# Patient Record
Sex: Male | Born: 1950
Health system: Southern US, Community
[De-identification: ages and names within clinical notes are randomized; demographics above are authoritative.]

## PROBLEM LIST (undated history)

## (undated) DIAGNOSIS — I10 Essential (primary) hypertension: Secondary | ICD-10-CM

## (undated) DIAGNOSIS — I639 Cerebral infarction, unspecified: Secondary | ICD-10-CM

## (undated) DIAGNOSIS — R7301 Impaired fasting glucose: Secondary | ICD-10-CM

## (undated) DIAGNOSIS — L718 Other rosacea: Secondary | ICD-10-CM

## (undated) DIAGNOSIS — Z6828 Body mass index (BMI) 28.0-28.9, adult: Secondary | ICD-10-CM

## (undated) DIAGNOSIS — G459 Transient cerebral ischemic attack, unspecified: Secondary | ICD-10-CM

## (undated) DIAGNOSIS — R42 Dizziness and giddiness: Secondary | ICD-10-CM

## (undated) DIAGNOSIS — E782 Mixed hyperlipidemia: Secondary | ICD-10-CM

## (undated) HISTORY — DX: Essential (primary) hypertension: I10

## (undated) HISTORY — DX: Other rosacea: L71.8

## (undated) HISTORY — DX: Cerebral infarction, unspecified: I63.9

## (undated) HISTORY — DX: Mixed hyperlipidemia: E78.2

## (undated) HISTORY — DX: Dizziness and giddiness: R42

## (undated) HISTORY — DX: Transient cerebral ischemic attack, unspecified: G45.9

## (undated) HISTORY — PX: ANAL FISSURE REPAIR: SHX2312

## (undated) HISTORY — DX: Impaired fasting glucose: R73.01

## (undated) HISTORY — PX: TONSILLECTOMY: SUR1361

## (undated) HISTORY — DX: Body mass index (BMI) 28.0-28.9, adult: Z68.28

---

## 2015-08-11 DIAGNOSIS — R972 Elevated prostate specific antigen [PSA]: Secondary | ICD-10-CM | POA: Diagnosis not present

## 2015-08-11 DIAGNOSIS — N401 Enlarged prostate with lower urinary tract symptoms: Secondary | ICD-10-CM | POA: Diagnosis not present

## 2015-09-02 DIAGNOSIS — R972 Elevated prostate specific antigen [PSA]: Secondary | ICD-10-CM | POA: Diagnosis not present

## 2015-09-02 DIAGNOSIS — N401 Enlarged prostate with lower urinary tract symptoms: Secondary | ICD-10-CM | POA: Diagnosis not present

## 2015-09-04 DIAGNOSIS — Z1211 Encounter for screening for malignant neoplasm of colon: Secondary | ICD-10-CM | POA: Diagnosis not present

## 2015-09-12 LAB — HM COLONOSCOPY

## 2015-09-22 DIAGNOSIS — Z1211 Encounter for screening for malignant neoplasm of colon: Secondary | ICD-10-CM | POA: Diagnosis not present

## 2015-09-22 DIAGNOSIS — K648 Other hemorrhoids: Secondary | ICD-10-CM | POA: Diagnosis not present

## 2015-09-22 DIAGNOSIS — D122 Benign neoplasm of ascending colon: Secondary | ICD-10-CM | POA: Diagnosis not present

## 2015-09-22 DIAGNOSIS — K5730 Diverticulosis of large intestine without perforation or abscess without bleeding: Secondary | ICD-10-CM | POA: Diagnosis not present

## 2015-09-30 DIAGNOSIS — N401 Enlarged prostate with lower urinary tract symptoms: Secondary | ICD-10-CM | POA: Diagnosis not present

## 2015-09-30 DIAGNOSIS — N419 Inflammatory disease of prostate, unspecified: Secondary | ICD-10-CM | POA: Diagnosis not present

## 2015-09-30 DIAGNOSIS — R972 Elevated prostate specific antigen [PSA]: Secondary | ICD-10-CM | POA: Diagnosis not present

## 2015-10-07 DIAGNOSIS — R972 Elevated prostate specific antigen [PSA]: Secondary | ICD-10-CM | POA: Diagnosis not present

## 2015-10-07 DIAGNOSIS — N529 Male erectile dysfunction, unspecified: Secondary | ICD-10-CM | POA: Diagnosis not present

## 2015-10-07 DIAGNOSIS — N401 Enlarged prostate with lower urinary tract symptoms: Secondary | ICD-10-CM | POA: Diagnosis not present

## 2015-10-13 DIAGNOSIS — Z23 Encounter for immunization: Secondary | ICD-10-CM | POA: Diagnosis not present

## 2015-10-13 DIAGNOSIS — E782 Mixed hyperlipidemia: Secondary | ICD-10-CM | POA: Diagnosis not present

## 2015-10-30 DIAGNOSIS — H2513 Age-related nuclear cataract, bilateral: Secondary | ICD-10-CM | POA: Diagnosis not present

## 2015-11-19 DIAGNOSIS — L578 Other skin changes due to chronic exposure to nonionizing radiation: Secondary | ICD-10-CM | POA: Diagnosis not present

## 2015-11-19 DIAGNOSIS — L570 Actinic keratosis: Secondary | ICD-10-CM | POA: Diagnosis not present

## 2015-11-19 DIAGNOSIS — C44319 Basal cell carcinoma of skin of other parts of face: Secondary | ICD-10-CM | POA: Diagnosis not present

## 2015-11-19 DIAGNOSIS — L821 Other seborrheic keratosis: Secondary | ICD-10-CM | POA: Diagnosis not present

## 2016-01-14 DIAGNOSIS — H6121 Impacted cerumen, right ear: Secondary | ICD-10-CM | POA: Diagnosis not present

## 2016-01-14 DIAGNOSIS — E782 Mixed hyperlipidemia: Secondary | ICD-10-CM | POA: Diagnosis not present

## 2016-01-14 DIAGNOSIS — L853 Xerosis cutis: Secondary | ICD-10-CM | POA: Diagnosis not present

## 2016-04-15 DIAGNOSIS — Z1159 Encounter for screening for other viral diseases: Secondary | ICD-10-CM | POA: Diagnosis not present

## 2016-04-15 DIAGNOSIS — E782 Mixed hyperlipidemia: Secondary | ICD-10-CM | POA: Diagnosis not present

## 2016-04-15 DIAGNOSIS — H60541 Acute eczematoid otitis externa, right ear: Secondary | ICD-10-CM | POA: Diagnosis not present

## 2016-04-15 DIAGNOSIS — R7301 Impaired fasting glucose: Secondary | ICD-10-CM | POA: Diagnosis not present

## 2016-04-15 DIAGNOSIS — R42 Dizziness and giddiness: Secondary | ICD-10-CM | POA: Diagnosis not present

## 2016-04-21 DIAGNOSIS — R972 Elevated prostate specific antigen [PSA]: Secondary | ICD-10-CM | POA: Diagnosis not present

## 2016-04-21 DIAGNOSIS — N401 Enlarged prostate with lower urinary tract symptoms: Secondary | ICD-10-CM | POA: Diagnosis not present

## 2016-06-08 DIAGNOSIS — R42 Dizziness and giddiness: Secondary | ICD-10-CM | POA: Diagnosis not present

## 2016-08-12 DIAGNOSIS — I10 Essential (primary) hypertension: Secondary | ICD-10-CM | POA: Diagnosis not present

## 2016-08-12 DIAGNOSIS — E782 Mixed hyperlipidemia: Secondary | ICD-10-CM | POA: Diagnosis not present

## 2016-08-12 DIAGNOSIS — Z0001 Encounter for general adult medical examination with abnormal findings: Secondary | ICD-10-CM | POA: Diagnosis not present

## 2016-08-12 DIAGNOSIS — Z23 Encounter for immunization: Secondary | ICD-10-CM | POA: Diagnosis not present

## 2016-08-12 DIAGNOSIS — R42 Dizziness and giddiness: Secondary | ICD-10-CM | POA: Diagnosis not present

## 2016-08-19 DIAGNOSIS — R972 Elevated prostate specific antigen [PSA]: Secondary | ICD-10-CM | POA: Diagnosis not present

## 2016-09-28 DIAGNOSIS — I10 Essential (primary) hypertension: Secondary | ICD-10-CM | POA: Diagnosis not present

## 2016-09-28 DIAGNOSIS — Z23 Encounter for immunization: Secondary | ICD-10-CM | POA: Diagnosis not present

## 2016-09-28 DIAGNOSIS — E782 Mixed hyperlipidemia: Secondary | ICD-10-CM | POA: Diagnosis not present

## 2016-11-12 DIAGNOSIS — N401 Enlarged prostate with lower urinary tract symptoms: Secondary | ICD-10-CM | POA: Diagnosis not present

## 2016-11-12 DIAGNOSIS — R972 Elevated prostate specific antigen [PSA]: Secondary | ICD-10-CM | POA: Diagnosis not present

## 2016-11-23 DIAGNOSIS — L821 Other seborrheic keratosis: Secondary | ICD-10-CM | POA: Diagnosis not present

## 2016-11-23 DIAGNOSIS — C44319 Basal cell carcinoma of skin of other parts of face: Secondary | ICD-10-CM | POA: Diagnosis not present

## 2016-11-23 DIAGNOSIS — L219 Seborrheic dermatitis, unspecified: Secondary | ICD-10-CM | POA: Diagnosis not present

## 2016-11-23 DIAGNOSIS — L578 Other skin changes due to chronic exposure to nonionizing radiation: Secondary | ICD-10-CM | POA: Diagnosis not present

## 2016-12-21 DIAGNOSIS — R972 Elevated prostate specific antigen [PSA]: Secondary | ICD-10-CM | POA: Diagnosis not present

## 2016-12-21 DIAGNOSIS — R9721 Rising PSA following treatment for malignant neoplasm of prostate: Secondary | ICD-10-CM | POA: Diagnosis not present

## 2017-01-20 DIAGNOSIS — I10 Essential (primary) hypertension: Secondary | ICD-10-CM | POA: Diagnosis not present

## 2017-01-20 DIAGNOSIS — E782 Mixed hyperlipidemia: Secondary | ICD-10-CM | POA: Diagnosis not present

## 2017-01-20 DIAGNOSIS — R7301 Impaired fasting glucose: Secondary | ICD-10-CM | POA: Diagnosis not present

## 2017-01-20 DIAGNOSIS — I69393 Ataxia following cerebral infarction: Secondary | ICD-10-CM | POA: Diagnosis not present

## 2017-01-24 DIAGNOSIS — I6523 Occlusion and stenosis of bilateral carotid arteries: Secondary | ICD-10-CM | POA: Diagnosis not present

## 2017-04-28 DIAGNOSIS — I69393 Ataxia following cerebral infarction: Secondary | ICD-10-CM | POA: Diagnosis not present

## 2017-04-28 DIAGNOSIS — E782 Mixed hyperlipidemia: Secondary | ICD-10-CM | POA: Diagnosis not present

## 2017-04-28 DIAGNOSIS — I10 Essential (primary) hypertension: Secondary | ICD-10-CM | POA: Diagnosis not present

## 2017-04-28 DIAGNOSIS — R7301 Impaired fasting glucose: Secondary | ICD-10-CM | POA: Diagnosis not present

## 2017-05-12 DIAGNOSIS — N401 Enlarged prostate with lower urinary tract symptoms: Secondary | ICD-10-CM | POA: Diagnosis not present

## 2017-05-12 DIAGNOSIS — R972 Elevated prostate specific antigen [PSA]: Secondary | ICD-10-CM | POA: Diagnosis not present

## 2017-05-12 DIAGNOSIS — R339 Retention of urine, unspecified: Secondary | ICD-10-CM | POA: Diagnosis not present

## 2017-05-16 DIAGNOSIS — I69393 Ataxia following cerebral infarction: Secondary | ICD-10-CM | POA: Diagnosis not present

## 2017-05-16 DIAGNOSIS — R270 Ataxia, unspecified: Secondary | ICD-10-CM | POA: Diagnosis not present

## 2017-05-16 DIAGNOSIS — I639 Cerebral infarction, unspecified: Secondary | ICD-10-CM | POA: Diagnosis not present

## 2017-05-20 ENCOUNTER — Encounter: Payer: Self-pay | Admitting: Neurology

## 2017-08-02 ENCOUNTER — Ambulatory Visit: Payer: Self-pay | Admitting: Neurology

## 2017-08-02 ENCOUNTER — Encounter

## 2017-08-22 NOTE — Progress Notes (Signed)
NEUROLOGY CONSULTATION NOTE  Jeremiah ChimesRonald A Russell MRN: [id number] DOB: May 15, 1950  Referring provider: Blane OharaKirsten Cox, MD Primary care provider: Blane OharaKirsten Cox, MD  Reason for consult:  ataxia  HISTORY OF PRESENT ILLNESS: Jeremiah ChimesRonald A Russell is a 67 year old right-handed male hypertension, hyperlipidemia, and history of several strokes including cerebellar stroke with residual ataxia who presents for ataxia.  History supplemented by referring provider's note.  He has significant stroke risk factors and a stroke in 1998.  At that time, he woke up with left arm numbness and was found to have a stroke.  He had stopped taking ASA.  He started having trouble with balance in 2006.  He feels it has gotten worse over the past few months.  If he walks on uneven ground, he loses his balance.  He denies dizziness, spinning or lightheadedness.  If he stands in the shower, he feels like he may fall.  He fell in the bedroom when he lost his balance when he bent over to pick up a pillow.  He has to be careful when walking down the stairs.  Walking upstairs is not as much of a problem. Vision is good.  He denies numbness in the feet.  He denies weakness in the legs.  He denies back pain.  He was started on metformin 3 or 4 months ago for pre-diabetes.  He restarted ASA 81mg  3 months ago as well.    MRI of head without contrast from 05/16/17 was personally reviewed and showed small 5 mm punctate acute infarct in the left parietal white matter as well as possible acute versus chronic punctate infarct in right frontal white matter.  Otherwise, moderate atrophy with extensive chronic ischemic changes in the cerebral white matter and brainstem.  He had a carotid doppler on 01/24/17, which reportedly showed bilateral thickening of the CCA, bulb, proximal ICA, ECA intima and plaque in the bulb and proximal ICA based on the sonographer's comments, but report from reading physician not available.    Medications include:  ASA 81mg ,  Plavix 75mg , lisinopril 5mg , metformin, Crestor 10mg   PAST MEDICAL HISTORY: Hypertension Hyperlipidemia Carotid artery disease Prediabetes History of stroke  PAST SURGICAL HISTORY: Anal fissure repair  MEDICATIONS: ASA 81mg  daily Plavix 75mg  daily Metformin 500mg  daily Lisinopril 5mg  daily Crestor 10mg  daily   ALLERGIES: No  FAMILY HISTORY: Father - Alzheimer's   SOCIAL HISTORY: Social History   Socioeconomic History  . Marital status: Single    Spouse name: Not on file  . Number of children: Not on file  . Years of education: Not on file  . Highest education level: Not on file  Occupational History  . Not on file  Social Needs  . Financial resource strain: Not on file  . Food insecurity:    Worry: Not on file    Inability: Not on file  . Transportation needs:    Medical: Not on file    Non-medical: Not on file  Tobacco Use  . Smoking status: Not on file  Substance and Sexual Activity  . Alcohol use: Not on file  . Drug use: Not on file  . Sexual activity: Not on file  Lifestyle  . Physical activity:    Days per week: Not on file    Minutes per session: Not on file  . Stress: Not on file  Relationships  . Social connections:    Talks on phone: Not on file    Gets together: Not on file    Attends religious  service: Not on file    Active member of club or organization: Not on file    Attends meetings of clubs or organizations: Not on file    Relationship status: Not on file  . Intimate partner violence:    Fear of current or ex partner: Not on file    Emotionally abused: Not on file    Physically abused: Not on file    Forced sexual activity: Not on file  Other Topics Concern  . Not on file  Social History Narrative  . Not on file    REVIEW OF SYSTEMS: Constitutional: No fevers, chills, or sweats, no generalized fatigue, change in appetite Eyes: No visual changes, double vision, eye pain Ear, nose and throat: No hearing loss, ear pain, nasal  congestion, sore throat Cardiovascular: No chest pain, palpitations Respiratory:  No shortness of breath at rest or with exertion, wheezes GastrointestinaI: No nausea, vomiting, diarrhea, abdominal pain, fecal incontinence Genitourinary:  No dysuria, urinary retention or frequency Musculoskeletal:  No neck pain, back pain Integumentary: No rash, pruritus, skin lesions Neurological: as above Psychiatric: No depression, insomnia, anxiety Endocrine: No palpitations, fatigue, diaphoresis, mood swings, change in appetite, change in weight, increased thirst Hematologic/Lymphatic:  No purpura, petechiae. Allergic/Immunologic: no itchy/runny eyes, nasal congestion, recent allergic reactions, rashes  PHYSICAL EXAM: Blood pressure 114/76, pulse 70, height 5\' 8"  (1.727 m), weight 188 lb (85.3 kg), SpO2 98 %. General: No acute distress.  Patient appears well-groomed.   Head:  Normocephalic/atraumatic Eyes:  fundi examined but not visualized Neck: supple, no paraspinal tenderness, full range of motion Back: No paraspinal tenderness Heart: regular rate and rhythm Lungs: Clear to auscultation bilaterally. Vascular: No carotid bruits. Neurological Exam: Mental status: alert and oriented to person, place, and time, recent and remote memory intact, fund of knowledge intact, attention and concentration intact, speech fluent and not dysarthric, language intact. Cranial nerves: CN I: not tested CN II: pupils equal, round and reactive to light, visual fields intact CN III, IV, VI:  full range of motion, no nystagmus, no ptosis CN V: facial sensation intact CN VII: upper and lower face symmetric CN VIII: hearing intact CN IX, X: gag intact, uvula midline CN XI: sternocleidomastoid and trapezius muscles intact CN XII: tongue midline Bulk & Tone: normal, no fasciculations. Motor:  5/5 throughout  Sensation:  Pinprick sensation slightly reduced in toes and vibration sensation intact.  Deep Tendon  Reflexes:  Mildly brisk throughout, toes downgoing.   Finger to nose testing:  Without dysmetria.   Heel to shin:  Without dysmetria.   Gait:  Wide-based.  Able to turn. Romberg negative  IMPRESSION: 1.  Unsteady gait, unclear etiology.  He does not exhibit any significant peripheral neuropathy or weakness.  Given brisk reflexes, consider cervical spinal stenosis causing myelopathy.  It may be secondary to cerebrovascular disease involving the brainstem as well.   2.  Cerebrovascular disease with evidence of acute punctate strokes on MRI.  Incidental findings and not contributing to his symptoms. 3.  HTN 4.  Hyperlipidemia 5.  Prediabetes  PLAN: 1.  We will check MRI of cervical spine to evaluate for spinal stenosis causing myelopathy. 2.  Maximum medical management for secondary stroke prevention:  History is unclear.  From a secondary stroke standpoint, he does not need to remain on dual antiplatelet therapy.  Consider continuing either ASA or Plavix for secondary stroke prevention 3.  Statin therapy (LDL goal less than 70), blood pressure control and glycemic control as per PCP. 4.  If MRI unremarkable, recommend physical therapy  Thank you for allowing me to take part in the care of this patient.  Shon Millet, DO  CC:  Blane Ohara, MD

## 2017-08-23 ENCOUNTER — Ambulatory Visit: Payer: Medicare Other | Admitting: Neurology

## 2017-08-23 ENCOUNTER — Encounter: Payer: Self-pay | Admitting: Neurology

## 2017-08-23 VITALS — BP 114/76 | HR 70 | Ht 68.0 in | Wt 188.0 lb

## 2017-08-23 DIAGNOSIS — I10 Essential (primary) hypertension: Secondary | ICD-10-CM | POA: Diagnosis not present

## 2017-08-23 DIAGNOSIS — R292 Abnormal reflex: Secondary | ICD-10-CM

## 2017-08-23 DIAGNOSIS — E785 Hyperlipidemia, unspecified: Secondary | ICD-10-CM

## 2017-08-23 DIAGNOSIS — R7303 Prediabetes: Secondary | ICD-10-CM

## 2017-08-23 DIAGNOSIS — I679 Cerebrovascular disease, unspecified: Secondary | ICD-10-CM | POA: Diagnosis not present

## 2017-08-23 DIAGNOSIS — R2681 Unsteadiness on feet: Secondary | ICD-10-CM

## 2017-08-23 NOTE — Patient Instructions (Addendum)
1.  We will check MRI of cervical spine to evaluate for pinching of spinal cord that may be contributing to balance problems 2.  Further recommendations pending results.  We [facility] and they will call you directly to schedule your appt. They are located at [address] If you need to contact them directly please call [phone].

## 2017-09-06 ENCOUNTER — Ambulatory Visit
Admission: RE | Admit: 2017-09-06 | Discharge: 2017-09-06 | Disposition: A | Discharge status: Home / Self Care | Payer: Medicare Other | Source: Ambulatory Visit | Attending: Neurology | Admitting: Neurology

## 2017-09-06 DIAGNOSIS — R2681 Unsteadiness on feet: Secondary | ICD-10-CM

## 2017-09-06 DIAGNOSIS — M4802 Spinal stenosis, cervical region: Secondary | ICD-10-CM | POA: Diagnosis not present

## 2017-09-06 DIAGNOSIS — R292 Abnormal reflex: Secondary | ICD-10-CM

## 2017-09-08 ENCOUNTER — Telehealth: Payer: Self-pay

## 2017-09-08 ENCOUNTER — Telehealth: Payer: Self-pay | Admitting: Neurology

## 2017-09-08 DIAGNOSIS — R2681 Unsteadiness on feet: Secondary | ICD-10-CM

## 2017-09-08 NOTE — Telephone Encounter (Signed)
-----   Message from Drema Dallas, DO sent at 09/06/2017 12:36 PM EDT ----- MRI of cervical spine does not reveal any cause for unsteady gait.  Recommend physical therapy for unsteady gait.

## 2017-09-08 NOTE — Telephone Encounter (Signed)
Patient called back because Meagen left a message to call back regarding MRI results. Please call him back at [phone]. Thanks.

## 2017-09-08 NOTE — Telephone Encounter (Signed)
Will refer to PT of his choice.

## 2017-09-08 NOTE — Telephone Encounter (Signed)
LMOM relaying message below.  Asked for return call about PT referral.

## 2017-09-13 DIAGNOSIS — Z125 Encounter for screening for malignant neoplasm of prostate: Secondary | ICD-10-CM | POA: Diagnosis not present

## 2017-09-13 DIAGNOSIS — E782 Mixed hyperlipidemia: Secondary | ICD-10-CM | POA: Diagnosis not present

## 2017-09-13 DIAGNOSIS — Z0000 Encounter for general adult medical examination without abnormal findings: Secondary | ICD-10-CM | POA: Diagnosis not present

## 2017-09-13 DIAGNOSIS — Z23 Encounter for immunization: Secondary | ICD-10-CM | POA: Diagnosis not present

## 2017-09-13 DIAGNOSIS — E663 Overweight: Secondary | ICD-10-CM | POA: Diagnosis not present

## 2017-09-13 DIAGNOSIS — Z0001 Encounter for general adult medical examination with abnormal findings: Secondary | ICD-10-CM | POA: Diagnosis not present

## 2017-09-13 DIAGNOSIS — R7301 Impaired fasting glucose: Secondary | ICD-10-CM | POA: Diagnosis not present

## 2017-09-13 DIAGNOSIS — Z6828 Body mass index (BMI) 28.0-28.9, adult: Secondary | ICD-10-CM | POA: Diagnosis not present

## 2017-09-14 NOTE — Telephone Encounter (Signed)
Returned call.  No answer.  LMOM asking for return call to relay MRI results  Sandi - this is a pt of Dr. Moises Blood, I have been playing phone tag with for a week now.

## 2017-09-14 NOTE — Telephone Encounter (Signed)
Called and spoke with Pt, advised of MRI results and P/T recommendation. Pt requests a therapy office in [facility]. Called [facility], [phone] spoke with Jesusita Oka. Fax#[phone], will fax order

## 2017-09-20 DIAGNOSIS — R262 Difficulty in walking, not elsewhere classified: Secondary | ICD-10-CM | POA: Diagnosis not present

## 2017-09-20 DIAGNOSIS — M6281 Muscle weakness (generalized): Secondary | ICD-10-CM | POA: Diagnosis not present

## 2017-09-27 DIAGNOSIS — M6281 Muscle weakness (generalized): Secondary | ICD-10-CM | POA: Diagnosis not present

## 2017-09-27 DIAGNOSIS — R262 Difficulty in walking, not elsewhere classified: Secondary | ICD-10-CM | POA: Diagnosis not present

## 2017-09-29 DIAGNOSIS — M6281 Muscle weakness (generalized): Secondary | ICD-10-CM | POA: Diagnosis not present

## 2017-09-29 DIAGNOSIS — R262 Difficulty in walking, not elsewhere classified: Secondary | ICD-10-CM | POA: Diagnosis not present

## 2017-10-04 DIAGNOSIS — R262 Difficulty in walking, not elsewhere classified: Secondary | ICD-10-CM | POA: Diagnosis not present

## 2017-10-04 DIAGNOSIS — M6281 Muscle weakness (generalized): Secondary | ICD-10-CM | POA: Diagnosis not present

## 2017-10-06 DIAGNOSIS — M6281 Muscle weakness (generalized): Secondary | ICD-10-CM | POA: Diagnosis not present

## 2017-10-06 DIAGNOSIS — R262 Difficulty in walking, not elsewhere classified: Secondary | ICD-10-CM | POA: Diagnosis not present

## 2017-10-11 DIAGNOSIS — M6281 Muscle weakness (generalized): Secondary | ICD-10-CM | POA: Diagnosis not present

## 2017-10-11 DIAGNOSIS — R262 Difficulty in walking, not elsewhere classified: Secondary | ICD-10-CM | POA: Diagnosis not present

## 2017-10-13 DIAGNOSIS — R262 Difficulty in walking, not elsewhere classified: Secondary | ICD-10-CM | POA: Diagnosis not present

## 2017-10-13 DIAGNOSIS — M6281 Muscle weakness (generalized): Secondary | ICD-10-CM | POA: Diagnosis not present

## 2017-10-18 DIAGNOSIS — M6281 Muscle weakness (generalized): Secondary | ICD-10-CM | POA: Diagnosis not present

## 2017-10-18 DIAGNOSIS — R262 Difficulty in walking, not elsewhere classified: Secondary | ICD-10-CM | POA: Diagnosis not present

## 2017-10-20 DIAGNOSIS — M6281 Muscle weakness (generalized): Secondary | ICD-10-CM | POA: Diagnosis not present

## 2017-10-20 DIAGNOSIS — R262 Difficulty in walking, not elsewhere classified: Secondary | ICD-10-CM | POA: Diagnosis not present

## 2017-10-25 DIAGNOSIS — R262 Difficulty in walking, not elsewhere classified: Secondary | ICD-10-CM | POA: Diagnosis not present

## 2017-10-25 DIAGNOSIS — M6281 Muscle weakness (generalized): Secondary | ICD-10-CM | POA: Diagnosis not present

## 2017-10-27 DIAGNOSIS — R262 Difficulty in walking, not elsewhere classified: Secondary | ICD-10-CM | POA: Diagnosis not present

## 2017-10-27 DIAGNOSIS — M6281 Muscle weakness (generalized): Secondary | ICD-10-CM | POA: Diagnosis not present

## 2017-11-01 DIAGNOSIS — M6281 Muscle weakness (generalized): Secondary | ICD-10-CM | POA: Diagnosis not present

## 2017-11-01 DIAGNOSIS — R262 Difficulty in walking, not elsewhere classified: Secondary | ICD-10-CM | POA: Diagnosis not present

## 2017-11-03 DIAGNOSIS — R262 Difficulty in walking, not elsewhere classified: Secondary | ICD-10-CM | POA: Diagnosis not present

## 2017-11-03 DIAGNOSIS — M6281 Muscle weakness (generalized): Secondary | ICD-10-CM | POA: Diagnosis not present

## 2017-11-08 DIAGNOSIS — M6281 Muscle weakness (generalized): Secondary | ICD-10-CM | POA: Diagnosis not present

## 2017-11-08 DIAGNOSIS — R262 Difficulty in walking, not elsewhere classified: Secondary | ICD-10-CM | POA: Diagnosis not present

## 2017-11-10 DIAGNOSIS — R262 Difficulty in walking, not elsewhere classified: Secondary | ICD-10-CM | POA: Diagnosis not present

## 2017-11-10 DIAGNOSIS — M6281 Muscle weakness (generalized): Secondary | ICD-10-CM | POA: Diagnosis not present

## 2017-11-15 DIAGNOSIS — R262 Difficulty in walking, not elsewhere classified: Secondary | ICD-10-CM | POA: Diagnosis not present

## 2017-11-15 DIAGNOSIS — M6281 Muscle weakness (generalized): Secondary | ICD-10-CM | POA: Diagnosis not present

## 2017-11-17 DIAGNOSIS — R262 Difficulty in walking, not elsewhere classified: Secondary | ICD-10-CM | POA: Diagnosis not present

## 2017-11-17 DIAGNOSIS — M6281 Muscle weakness (generalized): Secondary | ICD-10-CM | POA: Diagnosis not present

## 2017-11-22 DIAGNOSIS — R262 Difficulty in walking, not elsewhere classified: Secondary | ICD-10-CM | POA: Diagnosis not present

## 2017-11-22 DIAGNOSIS — M6281 Muscle weakness (generalized): Secondary | ICD-10-CM | POA: Diagnosis not present

## 2017-11-23 DIAGNOSIS — L821 Other seborrheic keratosis: Secondary | ICD-10-CM | POA: Diagnosis not present

## 2017-11-23 DIAGNOSIS — C44319 Basal cell carcinoma of skin of other parts of face: Secondary | ICD-10-CM | POA: Diagnosis not present

## 2017-11-23 DIAGNOSIS — L578 Other skin changes due to chronic exposure to nonionizing radiation: Secondary | ICD-10-CM | POA: Diagnosis not present

## 2017-11-24 DIAGNOSIS — M6281 Muscle weakness (generalized): Secondary | ICD-10-CM | POA: Diagnosis not present

## 2017-11-24 DIAGNOSIS — R262 Difficulty in walking, not elsewhere classified: Secondary | ICD-10-CM | POA: Diagnosis not present

## 2017-11-29 DIAGNOSIS — R262 Difficulty in walking, not elsewhere classified: Secondary | ICD-10-CM | POA: Diagnosis not present

## 2017-11-29 DIAGNOSIS — M6281 Muscle weakness (generalized): Secondary | ICD-10-CM | POA: Diagnosis not present

## 2017-12-06 DIAGNOSIS — M6281 Muscle weakness (generalized): Secondary | ICD-10-CM | POA: Diagnosis not present

## 2017-12-06 DIAGNOSIS — R262 Difficulty in walking, not elsewhere classified: Secondary | ICD-10-CM | POA: Diagnosis not present

## 2017-12-08 DIAGNOSIS — M6281 Muscle weakness (generalized): Secondary | ICD-10-CM | POA: Diagnosis not present

## 2017-12-08 DIAGNOSIS — R262 Difficulty in walking, not elsewhere classified: Secondary | ICD-10-CM | POA: Diagnosis not present

## 2017-12-13 DIAGNOSIS — R262 Difficulty in walking, not elsewhere classified: Secondary | ICD-10-CM | POA: Diagnosis not present

## 2017-12-13 DIAGNOSIS — M6281 Muscle weakness (generalized): Secondary | ICD-10-CM | POA: Diagnosis not present

## 2017-12-15 DIAGNOSIS — R262 Difficulty in walking, not elsewhere classified: Secondary | ICD-10-CM | POA: Diagnosis not present

## 2017-12-15 DIAGNOSIS — M6281 Muscle weakness (generalized): Secondary | ICD-10-CM | POA: Diagnosis not present

## 2017-12-20 DIAGNOSIS — M6281 Muscle weakness (generalized): Secondary | ICD-10-CM | POA: Diagnosis not present

## 2017-12-20 DIAGNOSIS — R262 Difficulty in walking, not elsewhere classified: Secondary | ICD-10-CM | POA: Diagnosis not present

## 2017-12-22 DIAGNOSIS — R262 Difficulty in walking, not elsewhere classified: Secondary | ICD-10-CM | POA: Diagnosis not present

## 2017-12-22 DIAGNOSIS — M6281 Muscle weakness (generalized): Secondary | ICD-10-CM | POA: Diagnosis not present

## 2018-01-03 DIAGNOSIS — M6281 Muscle weakness (generalized): Secondary | ICD-10-CM | POA: Diagnosis not present

## 2018-01-03 DIAGNOSIS — R262 Difficulty in walking, not elsewhere classified: Secondary | ICD-10-CM | POA: Diagnosis not present

## 2018-01-05 DIAGNOSIS — R262 Difficulty in walking, not elsewhere classified: Secondary | ICD-10-CM | POA: Diagnosis not present

## 2018-01-05 DIAGNOSIS — M6281 Muscle weakness (generalized): Secondary | ICD-10-CM | POA: Diagnosis not present

## 2018-01-06 DIAGNOSIS — I69393 Ataxia following cerebral infarction: Secondary | ICD-10-CM | POA: Diagnosis not present

## 2018-01-06 DIAGNOSIS — I10 Essential (primary) hypertension: Secondary | ICD-10-CM | POA: Diagnosis not present

## 2018-01-06 DIAGNOSIS — R7301 Impaired fasting glucose: Secondary | ICD-10-CM | POA: Diagnosis not present

## 2018-01-06 DIAGNOSIS — E782 Mixed hyperlipidemia: Secondary | ICD-10-CM | POA: Diagnosis not present

## 2018-01-11 DIAGNOSIS — N401 Enlarged prostate with lower urinary tract symptoms: Secondary | ICD-10-CM | POA: Diagnosis not present

## 2018-01-11 DIAGNOSIS — R972 Elevated prostate specific antigen [PSA]: Secondary | ICD-10-CM | POA: Diagnosis not present

## 2018-04-10 DIAGNOSIS — I10 Essential (primary) hypertension: Secondary | ICD-10-CM | POA: Diagnosis not present

## 2018-04-10 DIAGNOSIS — I69393 Ataxia following cerebral infarction: Secondary | ICD-10-CM | POA: Diagnosis not present

## 2018-04-10 DIAGNOSIS — E782 Mixed hyperlipidemia: Secondary | ICD-10-CM | POA: Diagnosis not present

## 2018-04-10 DIAGNOSIS — R7301 Impaired fasting glucose: Secondary | ICD-10-CM | POA: Diagnosis not present

## 2019-03-14 IMAGING — MR MR CERVICAL SPINE W/O CM
5 series · 29 of 48 positions shown · non-contrast
Comparison: None.

CLINICAL DATA: Unsteady gait for 6 months. Hyperreflexia of the
lower extremity.

EXAM:
MRI CERVICAL SPINE WITHOUT CONTRAST
TECHNIQUE: Multiplanar, multisequence MR imaging of the cervical spine was
performed. No intravenous contrast was administered.

[Series 3: T2 · sagittal · 3.0mm · 0.41mm/px · 6 of 15 slices shown (1 of 2)]
[im 1/15]
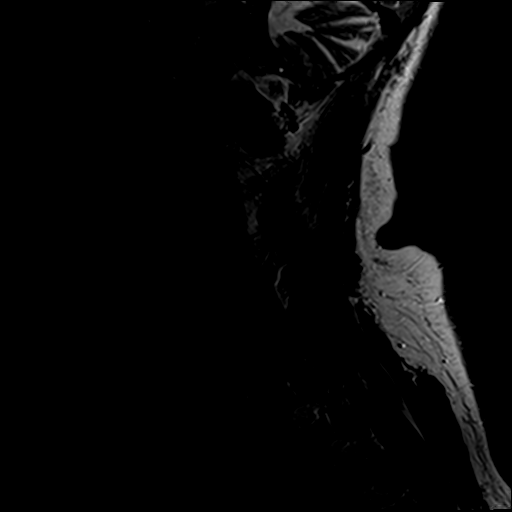
[im 3/15]
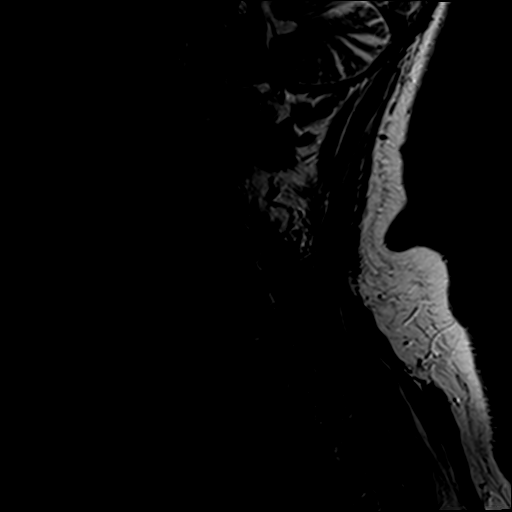
[im 6/15]
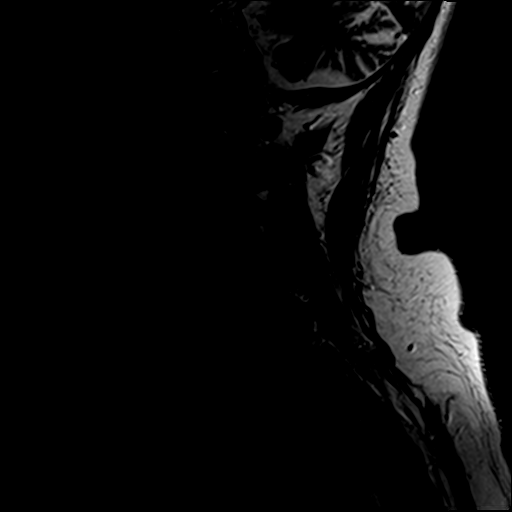
[im 9/15]
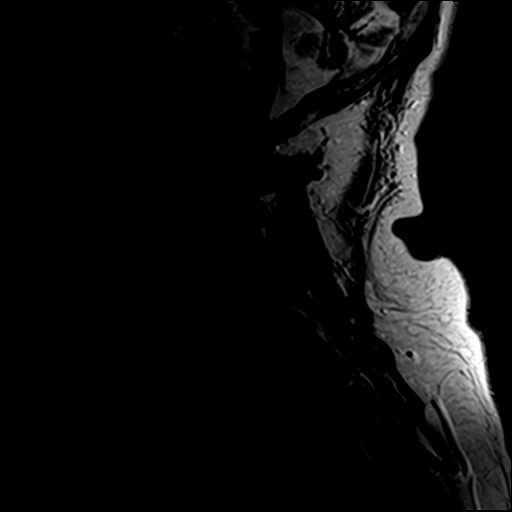
[im 12/15]
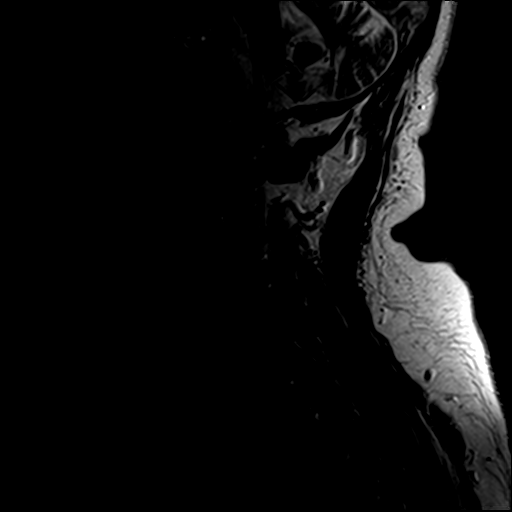
[im 15/15]
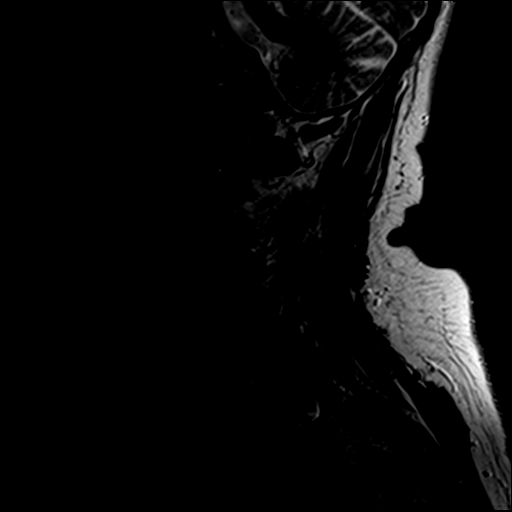

[Series 4: T1 · sagittal · 3.0mm · 0.41mm/px · 6 of 15 slices shown]
[im 1/15]
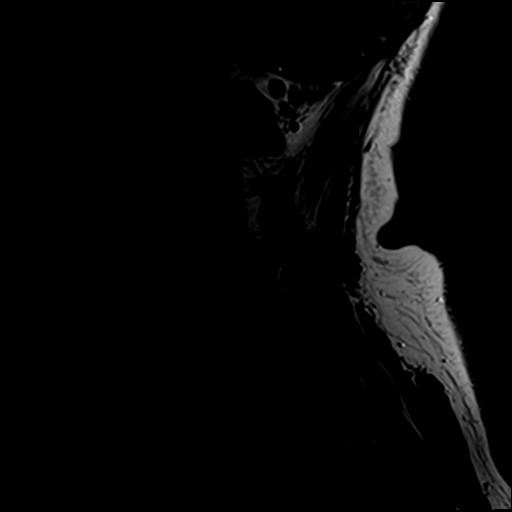
[im 3/15]
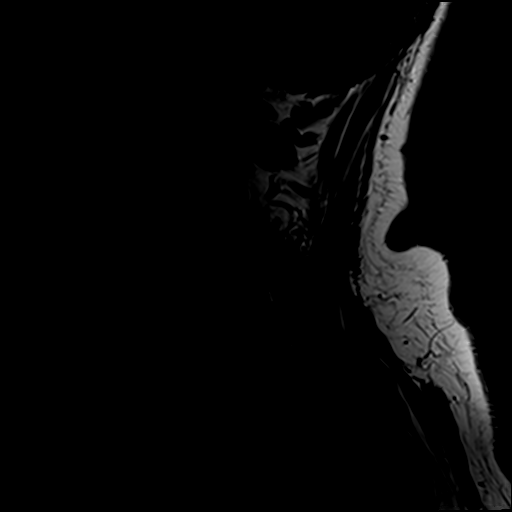
[im 6/15]
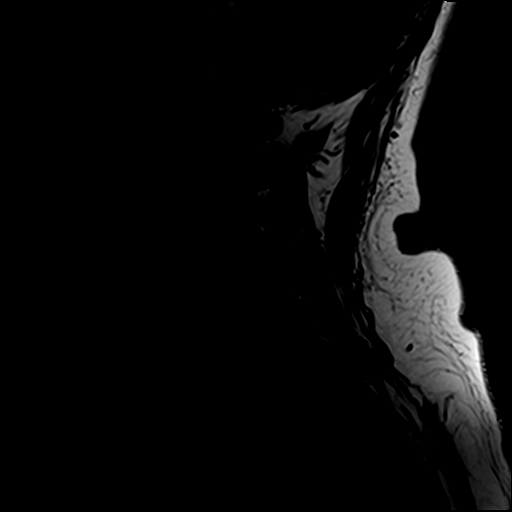
[im 9/15]
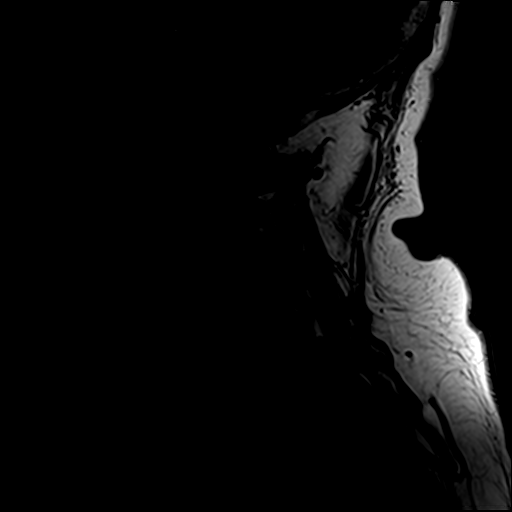
[im 12/15]
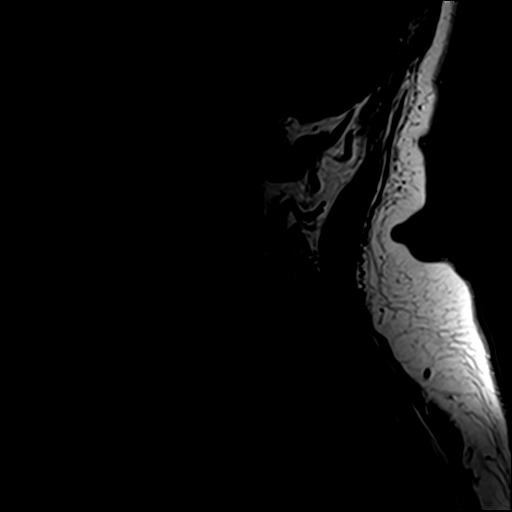
[im 15/15]
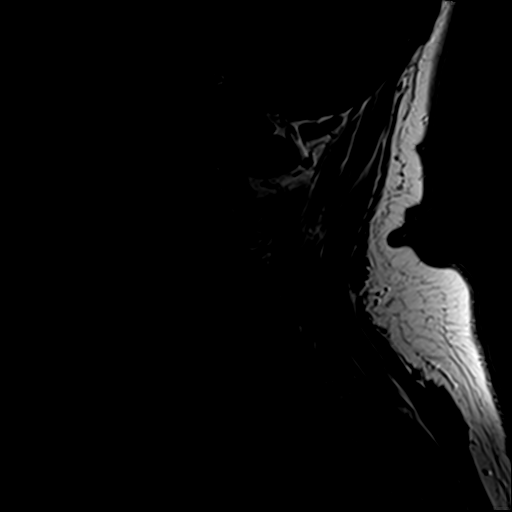

[Series 5: STIR · sagittal · 3.0mm · 0.82mm/px · 6 of 15 slices shown]
[im 1/15]
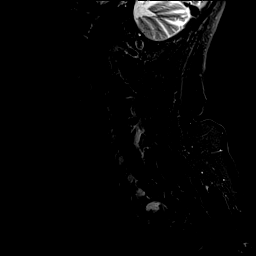
[im 3/15]
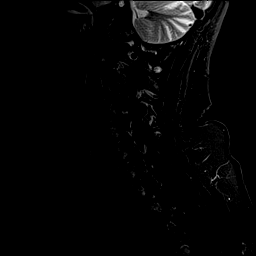
[im 6/15]
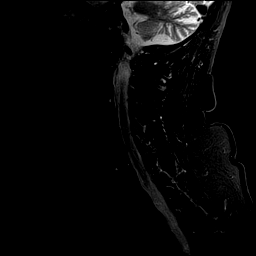
[im 9/15]
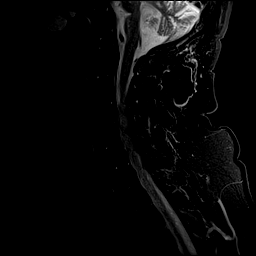
[im 12/15]
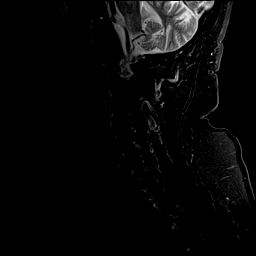
[im 15/15]
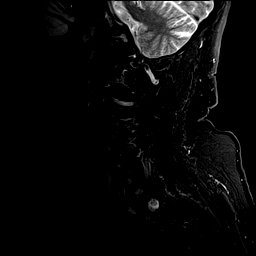

[Series 6: GRE · axial · 3.0mm · 0.35mm/px · z∈[-103,-85]mm · 2 of 36 slices shown]
[im 1/36]
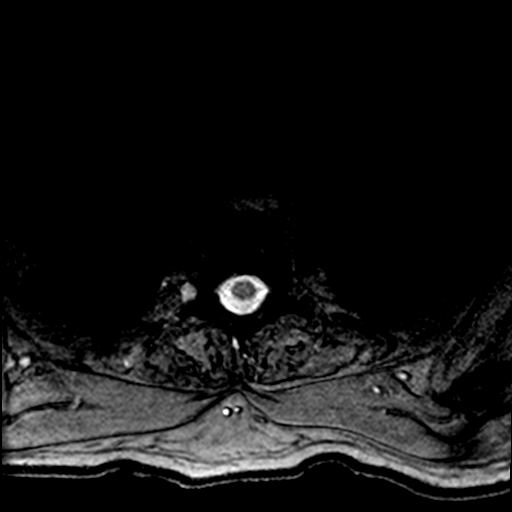
[im 6/36]
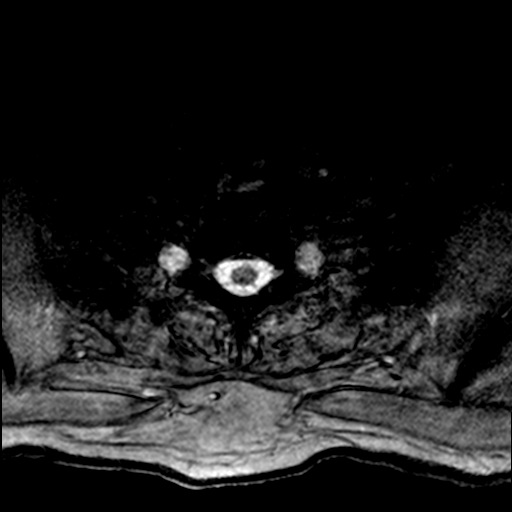

[Series 8: T2 · axial · 3.0mm · 0.70mm/px · z∈[-103,+28]mm · 9 of 36 slices shown (2 of 2)]
[im 1/36]
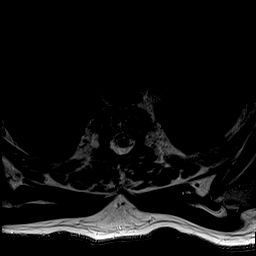
[im 6/36]
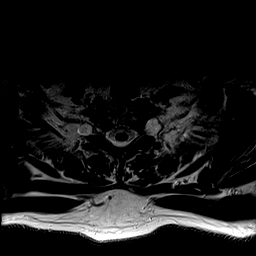
[im 11/36]
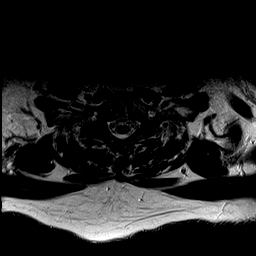
[im 16/36]
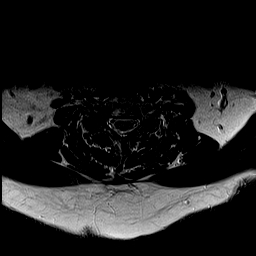
[im 18/36]
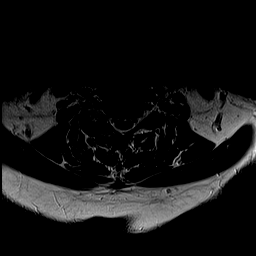
[im 21/36]
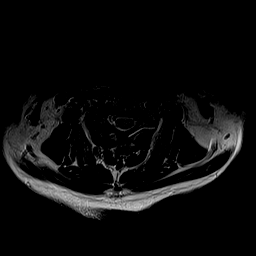
[im 26/36]
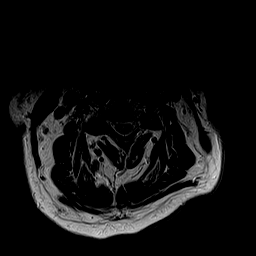
[im 31/36]
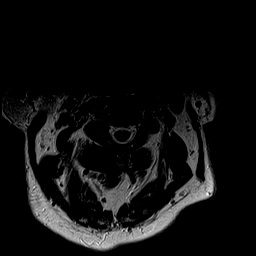
[im 36/36]
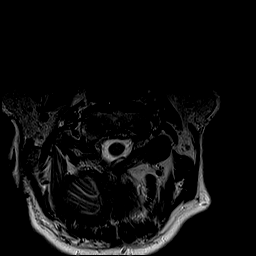

[29 of 48 positions shown; findings below may reference images not displayed]

FINDINGS: Alignment: Physiologic.

Vertebrae: No fracture, evidence of discitis, or bone lesion.

Cord: Normal signal and morphology.

Posterior Fossa, vertebral arteries, paraspinal tissues: Negative.

Disc levels:

C2-3: No abnormality of the disc. Minimal right facet arthritis. No
foraminal stenosis.

C3-4: Small broad-based disc bulge with no neural impingement.
Minimal right facet arthritis. Slight narrowing of the right neural
foramen.

C4-5: No significant abnormality of the disc. Slight right facet
arthritis. Minimal narrowing of the neural foramina.

C5-6: Tiny broad-based disc bulge with no neural impingement. Slight
right foraminal narrowing. Moderate right facet arthritis.

C6-7: Tiny disc bulge just to the right of midline with no neural
impingement. No foraminal stenosis.

C7-T1: Slight right facet arthritis. No foraminal stenosis. No
significant disc abnormality.
IMPRESSION: 1. No significant foraminal or spinal stenosis.
2. Normal appearing cervical spinal cord.
3. Moderate right facet arthritis at C5-6.

## 2019-04-02 ENCOUNTER — Other Ambulatory Visit: Payer: Self-pay | Admitting: Family Medicine

## 2019-04-24 ENCOUNTER — Telehealth: Payer: Self-pay | Admitting: Family Medicine

## 2019-04-24 NOTE — Progress Notes (Signed)
°  Chronic Care Management   Outreach Note  04/24/2019 Name: Jeremiah Russell MRN: [id number] DOB: 10-12-1950  Referred by: Blane Ohara, MD Reason for referral : No chief complaint on file.   An unsuccessful telephone outreach was attempted today. The patient was referred to the pharmacist for assistance with care management and care coordination.   Follow Up Plan:   Lynnae January Upstream Scheduler

## 2019-05-11 ENCOUNTER — Ambulatory Visit: Payer: Medicare Other | Admitting: Family Medicine

## 2019-05-18 NOTE — Progress Notes (Deleted)
Subjective:  Patient ID: Jeremiah Russell, male    DOB: [birth year]  Age: 69 y.o. MRN: [id number]  No chief complaint on file.   HPI    No past medical history on file. Past Surgical History:  Procedure Laterality Date  . ANAL FISSURE REPAIR      No family history on file. Social History   Socioeconomic History  . Marital status: Single    Spouse name: Not on file  . Number of children: Not on file  . Years of education: Not on file  . Highest education level: Some college, no degree  Occupational History  . Occupation: Retired  Tobacco Use  . Smoking status: Former Smoker    Quit date: 01/05/1999    Years since quitting: 20.3  . Smokeless tobacco: Never Used  Substance and Sexual Activity  . Alcohol use: Not Currently  . Drug use: Never  . Sexual activity: Not on file  Other Topics Concern  . Not on file  Social History Narrative   Patient is right-handed. He lives alone in a 2 story house with a basement.   Social Determinants of Health   Financial Resource Strain:   . Difficulty of Paying Living Expenses:   Food Insecurity:   . Worried About Programme researcher, broadcasting/film/video in the Last Year:   . Barista in the Last Year:   Transportation Needs:   . Freight forwarder (Medical):   Marland Kitchen Lack of Transportation (Non-Medical):   Physical Activity:   . Days of Exercise per Week:   . Minutes of Exercise per Session:   Stress:   . Feeling of Stress :   Social Connections:   . Frequency of Communication with Friends and Family:   . Frequency of Social Gatherings with Friends and Family:   . Attends Religious Services:   . Active Member of Clubs or Organizations:   . Attends Banker Meetings:   Marland Kitchen Marital Status:     Review of Systems  Constitutional: Negative for chills, diaphoresis, fatigue and fever.  HENT: Negative for congestion, ear pain and sore throat.   Respiratory: Negative for cough and shortness of breath.   Cardiovascular: Negative for  chest pain and leg swelling.  Gastrointestinal: Negative for abdominal pain, constipation, diarrhea, nausea and vomiting.  Genitourinary: Negative for dysuria and urgency.  Musculoskeletal: Negative for arthralgias and myalgias.  Neurological: Negative for dizziness and headaches.  Psychiatric/Behavioral: Negative for dysphoric mood.     Objective:  There were no vitals taken for this visit.  BP/Weight 08/23/2017  Systolic BP 114  Diastolic BP 76  Wt. (Lbs) 188  BMI 28.59    Physical Exam Vitals reviewed.  Constitutional:      Appearance: Normal appearance. He is normal weight.  Cardiovascular:     Rate and Rhythm: Normal rate and regular rhythm.  Pulmonary:     Effort: Pulmonary effort is normal.     Breath sounds: Normal breath sounds.  Abdominal:     General: Abdomen is flat. Bowel sounds are normal.     Palpations: Abdomen is soft.  Neurological:     Mental Status: He is alert and oriented to person, place, and time.  Psychiatric:        Mood and Affect: Mood normal.        Behavior: Behavior normal.     Diabetic Foot Exam - Simple   No data filed       No results found for: WBC,  HGB, HCT, PLT, GLUCOSE, CHOL, TRIG, HDL, LDLDIRECT, LDLCALC, ALT, AST, NA, K, CL, CREATININE, BUN, CO2, TSH, PSA, INR, GLUF, HGBA1C, MICROALBUR    Assessment & Plan:   There are no diagnoses linked to this encounter.   No orders of the defined types were placed in this encounter.   No orders of the defined types were placed in this encounter.    Follow-up: No follow-ups on file.  An After Visit Summary was printed and given to the patient.  [facility] [phone]

## 2019-05-22 ENCOUNTER — Ambulatory Visit: Payer: Medicare Other | Admitting: Family Medicine

## 2019-05-30 ENCOUNTER — Encounter: Payer: Self-pay | Admitting: Family Medicine

## 2019-05-30 DIAGNOSIS — E782 Mixed hyperlipidemia: Secondary | ICD-10-CM | POA: Insufficient documentation

## 2019-05-30 DIAGNOSIS — R7301 Impaired fasting glucose: Secondary | ICD-10-CM | POA: Insufficient documentation

## 2019-05-30 DIAGNOSIS — I10 Essential (primary) hypertension: Secondary | ICD-10-CM | POA: Insufficient documentation

## 2019-05-30 NOTE — Progress Notes (Signed)
Cancelled. kc 

## 2019-06-04 ENCOUNTER — Encounter: Payer: Self-pay | Admitting: Family Medicine

## 2019-06-04 DIAGNOSIS — I69393 Ataxia following cerebral infarction: Secondary | ICD-10-CM

## 2019-06-04 DIAGNOSIS — L718 Other rosacea: Secondary | ICD-10-CM | POA: Insufficient documentation

## 2019-06-04 DIAGNOSIS — R260 Ataxic gait: Secondary | ICD-10-CM

## 2019-06-04 HISTORY — DX: Ataxia following cerebral infarction: I69.393

## 2019-06-04 HISTORY — DX: Ataxic gait: R26.0

## 2019-06-05 ENCOUNTER — Ambulatory Visit (INDEPENDENT_AMBULATORY_CARE_PROVIDER_SITE_OTHER): Payer: Medicare Other | Admitting: Family Medicine

## 2019-06-05 DIAGNOSIS — L718 Other rosacea: Secondary | ICD-10-CM

## 2019-06-05 DIAGNOSIS — I10 Essential (primary) hypertension: Secondary | ICD-10-CM

## 2019-06-05 DIAGNOSIS — I69393 Ataxia following cerebral infarction: Secondary | ICD-10-CM

## 2019-06-05 DIAGNOSIS — E782 Mixed hyperlipidemia: Secondary | ICD-10-CM

## 2019-06-05 DIAGNOSIS — R7301 Impaired fasting glucose: Secondary | ICD-10-CM

## 2019-06-05 DIAGNOSIS — R260 Ataxic gait: Secondary | ICD-10-CM

## 2019-06-13 ENCOUNTER — Telehealth: Payer: Self-pay | Admitting: Family Medicine

## 2019-06-13 NOTE — Progress Notes (Signed)
  Chronic Care Management   Outreach Note  06/13/2019 Name: Jeremiah Russell MRN: [id number] DOB: [birth year]  Referred by: Blane Ohara, MD Reason for referral : No chief complaint on file.   An unsuccessful telephone outreach was attempted today. The patient was referred to the pharmacist for assistance with care management and care coordination.   This note is not being shared with the patient for the following reason: To respect privacy (The patient or proxy has requested that the information not be shared).  Follow Up Plan:   Lynnae January Upstream Scheduler

## 2019-06-18 ENCOUNTER — Ambulatory Visit: Payer: Medicare Other | Admitting: Family Medicine

## 2019-07-05 ENCOUNTER — Telehealth: Payer: Self-pay | Admitting: Family Medicine

## 2019-07-05 NOTE — Progress Notes (Signed)
  Chronic Care Management   Outreach Note  07/05/2019 Name: Jeremiah Russell MRN: [id number] DOB: [birth year]  Referred by: Blane Ohara, MD Reason for referral : No chief complaint on file.   An unsuccessful telephone outreach was attempted today. The patient was referred to the pharmacist for assistance with care management and care coordination.  This note is not being shared with the patient for the following reason: To respect privacy (The patient or proxy has requested that the information not be shared).  Follow Up Plan:   Lynnae January Upstream Scheduler

## 2019-07-19 ENCOUNTER — Ambulatory Visit: Payer: Medicare Other | Admitting: Family Medicine

## 2019-07-23 ENCOUNTER — Other Ambulatory Visit: Payer: Self-pay

## 2019-07-23 ENCOUNTER — Encounter: Payer: Self-pay | Admitting: Family Medicine

## 2019-07-23 ENCOUNTER — Ambulatory Visit (INDEPENDENT_AMBULATORY_CARE_PROVIDER_SITE_OTHER): Payer: Medicare Other | Admitting: Family Medicine

## 2019-07-23 VITALS — BP 130/84 | HR 76 | Temp 98.1°F | Resp 18 | Ht 68.0 in | Wt 178.4 lb

## 2019-07-23 DIAGNOSIS — E782 Mixed hyperlipidemia: Secondary | ICD-10-CM | POA: Diagnosis not present

## 2019-07-23 DIAGNOSIS — L570 Actinic keratosis: Secondary | ICD-10-CM

## 2019-07-23 DIAGNOSIS — I10 Essential (primary) hypertension: Secondary | ICD-10-CM

## 2019-07-23 DIAGNOSIS — R7301 Impaired fasting glucose: Secondary | ICD-10-CM | POA: Diagnosis not present

## 2019-07-23 DIAGNOSIS — R260 Ataxic gait: Secondary | ICD-10-CM

## 2019-07-23 LAB — POCT UA - MICROALBUMIN: Microalbumin Ur, POC: 30 mg/L

## 2019-07-23 NOTE — Progress Notes (Signed)
Subjective:  Patient ID: Jeremiah Russell, male    DOB: May 07, 1950  Age: 69 y.o. MRN: [id number]  Chief Complaint  Patient presents with  . Hyperlipidemia  . Hypertension    HPI  HTN-IFG-Hyperlipidemia-Ataxic gait-h/o CVA-AK face-No medications x 6 months Previously seen by derm [place]. No recent labwork.  Past Medical History:  Diagnosis Date  . Adult BMI 28.0-28.9 kg/sq m   . 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 Ataxic gait 06/04/2019  . Essential hypertension   . Impaired fasting glucose   . Mini stroke (HCC)   . Mixed hyperlipidemia   . Other rosacea   . Vertigo    Past Surgical History:  Procedure Laterality Date  . ANAL FISSURE REPAIR    . TONSILLECTOMY      Family History  Problem Relation Age of Onset  . Alzheimer's disease Mother   . Cancer Paternal Grandfather   lives alone Social History   Socioeconomic History  . Marital status: Single    Spouse name: Not on file  . Number of children: Not on file  . Years of education: Not on file  . Highest education level: Some college, no degree  Occupational History  . Occupation: Retired  Tobacco Use  . Smoking status: Former Smoker    Packs/day: 3.00    Years: 66.00    Pack years: 198.00    Types: Cigarettes    Quit date: 01/05/1999    Years since quitting: 20.5  . Smokeless tobacco: Never Used  Substance and Sexual Activity  . Alcohol use: Not Currently  . Drug use: Never  . Sexual activity: Not on file  Other Topics Concern  . Not on file  Social History Narrative   Patient is right-handed. He lives alone in a 2 story house with a basement.   Social Determinants of Health   Financial Resource Strain:   . Difficulty of Paying Living Expenses:   Food Insecurity:   . Worried About Programme researcher, broadcasting/film/video in the Last Year:   . Barista in the Last Year:   Transportation Needs:   . Freight forwarder (Medical):   Marland Kitchen Lack of Transportation (Non-Medical):   Physical Activity:     . Days of Exercise per Week:   . Minutes of Exercise per Session:   Stress:   . Feeling of Stress :   Social Connections:   . Frequency of Communication with Friends and Family:   . Frequency of Social Gatherings with Friends and Family:   . Attends Religious Services:   . Active Member of Clubs or Organizations:   . Attends Banker Meetings:   Marland Kitchen Marital Status:     Review of Systems  Constitutional: Negative for chills, fatigue and fever.  HENT: Negative for congestion, ear pain and sore throat.   Respiratory: Negative for cough and shortness of breath.   Cardiovascular: Negative for chest pain and palpitations.  Musculoskeletal: Positive for gait problem.  Skin:       Ak on face-h/o derm evaluation  Neurological: Positive for dizziness. Negative for weakness and headaches.       Cva in the past  Psychiatric/Behavioral: Negative for dysphoric mood. The patient is not nervous/anxious.      Objective:  BP 130/84   Pulse 76   Temp 98.1 F (36.7 C)   Resp 18   Ht 5\' 8"  (1.727 m)   Wt 178 lb 6.4 oz (80.9 kg)   BMI  27.13 kg/m   BP/Weight 07/23/2019 08/23/2017  Systolic BP 130 114  Diastolic BP 84 76  Wt. (Lbs) 178.4 188  BMI 27.13 28.59    Physical Exam Constitutional:      Appearance: Normal appearance.  Cardiovascular:     Rate and Rhythm: Regular rhythm.     Pulses: Normal pulses.     Heart sounds: Normal heart sounds.  Pulmonary:     Effort: Pulmonary effort is normal.     Breath sounds: Normal breath sounds.  Musculoskeletal:     Cervical back: Normal range of motion and neck supple.  Skin:    Comments: AK x 3 face  Neurological:     Mental Status: He is alert and oriented to person, place, and time.     Gait: Gait abnormal.     Comments:  PT for balance after CVA  Psychiatric:        Mood and Affect: Mood normal.        Behavior: Behavior normal.   A/P-1. Essential hypertension Blood pressure off medications normal range -  Comprehensive metabolic panel - TSH  2. Impaired fasting glucose No medication currently - Hemoglobin A1c - POCT UA - Microalbumin - CBC with Differential/Platelet  3. Mixed hyperlipidemia No medication currently - Lipid panel  4. AK (actinic keratosis) - Ambulatory referral to Dermatology  5. Ataxic gait H/o of CVA - Ambulatory referral to Neurology  Follow-up:  An After Visit Summary was printed and given to the patient.  [facility] [phone]

## 2019-07-24 DIAGNOSIS — L570 Actinic keratosis: Secondary | ICD-10-CM | POA: Insufficient documentation

## 2019-07-24 LAB — CBC WITH DIFFERENTIAL/PLATELET
Basophils Absolute: 0 10*3/uL (ref 0.0–0.2)
Basos: 1 %
EOS (ABSOLUTE): 0.1 10*3/uL (ref 0.0–0.4)
Eos: 1 %
Hematocrit: 49.7 % (ref 37.5–51.0)
Hemoglobin: 17.7 g/dL (ref 13.0–17.7)
Immature Grans (Abs): 0 10*3/uL (ref 0.0–0.1)
Immature Granulocytes: 1 %
Lymphocytes Absolute: 1.4 10*3/uL (ref 0.7–3.1)
Lymphs: 20 %
MCH: 32.5 pg (ref 26.6–33.0)
MCHC: 35.6 g/dL (ref 31.5–35.7)
MCV: 91 fL (ref 79–97)
Monocytes Absolute: 0.7 10*3/uL (ref 0.1–0.9)
Monocytes: 10 %
Neutrophils Absolute: 4.9 10*3/uL (ref 1.4–7.0)
Neutrophils: 67 %
Platelets: 260 10*3/uL (ref 150–450)
RBC: 5.44 x10E6/uL (ref 4.14–5.80)
RDW: 12.5 % (ref 11.6–15.4)
WBC: 7.2 10*3/uL (ref 3.4–10.8)

## 2019-07-24 LAB — LIPID PANEL
Chol/HDL Ratio: 5.4 ratio — ABNORMAL HIGH (ref 0.0–5.0)
Cholesterol, Total: 236 mg/dL — ABNORMAL HIGH (ref 100–199)
HDL: 44 mg/dL (ref >39)
LDL Chol Calc (NIH): 167 mg/dL — ABNORMAL HIGH (ref 0–99)
Triglycerides: 138 mg/dL (ref 0–149)
VLDL Cholesterol Cal: 25 mg/dL (ref 5–40)

## 2019-07-24 LAB — COMPREHENSIVE METABOLIC PANEL
ALT: 21 IU/L (ref 0–44)
AST: 18 IU/L (ref 0–40)
Albumin/Globulin Ratio: 1.5 (ref 1.2–2.2)
Albumin: 4.6 g/dL (ref 3.7–4.7)
Alkaline Phosphatase: 96 IU/L (ref 48–121)
BUN/Creatinine Ratio: 9 — ABNORMAL LOW (ref 10–24)
BUN: 8 mg/dL (ref 8–27)
Bilirubin Total: 0.8 mg/dL (ref 0.0–1.2)
CO2: 25 mmol/L (ref 20–29)
Calcium: 9.7 mg/dL (ref 8.6–10.2)
Chloride: 100 mmol/L (ref 96–106)
Creatinine, Ser: 0.92 mg/dL (ref 0.76–1.27)
GFR calc Af Amer: 96 mL/min/{1.73_m2} (ref >59)
GFR calc non Af Amer: 83 mL/min/{1.73_m2} (ref >59)
Globulin, Total: 3 g/dL (ref 1.5–4.5)
Glucose: 101 mg/dL — ABNORMAL HIGH (ref 65–99)
Potassium: 5.2 mmol/L (ref 3.5–5.2)
Sodium: 139 mmol/L (ref 134–144)
Total Protein: 7.6 g/dL (ref 6.0–8.5)

## 2019-07-24 LAB — CARDIOVASCULAR RISK ASSESSMENT

## 2019-07-24 LAB — HEMOGLOBIN A1C
Est. average glucose Bld gHb Est-mCnc: 117 mg/dL
Hgb A1c MFr Bld: 5.7 % — ABNORMAL HIGH (ref 4.8–5.6)

## 2019-07-24 LAB — TSH: TSH: 1.11 u[IU]/mL (ref 0.450–4.500)

## 2019-07-24 NOTE — Patient Instructions (Signed)
Derm -AK  Gait-Neurology

## 2019-07-25 ENCOUNTER — Other Ambulatory Visit: Payer: Self-pay | Admitting: Family Medicine

## 2019-07-25 MED ORDER — ROSUVASTATIN CALCIUM 10 MG PO TABS: 10.0000 mg | ORAL_TABLET | Freq: Every day | ORAL | 3 refills | Status: DC

## 2019-11-01 ENCOUNTER — Encounter: Payer: Self-pay | Admitting: Family Medicine

## 2019-11-01 ENCOUNTER — Ambulatory Visit (INDEPENDENT_AMBULATORY_CARE_PROVIDER_SITE_OTHER): Payer: Medicare Other | Admitting: Family Medicine

## 2019-11-01 VITALS — BP 128/82 | HR 65 | Temp 97.5°F | Ht 68.0 in | Wt 187.0 lb

## 2019-11-01 DIAGNOSIS — E782 Mixed hyperlipidemia: Secondary | ICD-10-CM

## 2019-11-01 DIAGNOSIS — I10 Essential (primary) hypertension: Secondary | ICD-10-CM

## 2019-11-01 DIAGNOSIS — R270 Ataxia, unspecified: Secondary | ICD-10-CM | POA: Diagnosis not present

## 2019-11-01 DIAGNOSIS — Z23 Encounter for immunization: Secondary | ICD-10-CM | POA: Diagnosis not present

## 2019-11-01 DIAGNOSIS — R7301 Impaired fasting glucose: Secondary | ICD-10-CM | POA: Diagnosis not present

## 2019-11-01 NOTE — Progress Notes (Signed)
Subjective:  Patient ID: Jeremiah Russell, male    DOB: September 28, 1950  Age: 69 y.o. MRN: [id number]  Chief Complaint  Patient presents with  . Hyperlipidemia  . Hypertension  . Prediabetes    HPI   HTN - no bp medicines.  Hyperlipidemia - Taking crestor 10 mg once daily.  Ataxic gait - able to walk around the house, but concerned about falls. No falls. Balance issues started 6 months ago. Gradually came on. No vertigo. Has had episodes where he would turn and then "just could not move." Worsens when looks straight up. Episodes last 15-20 minutes. Not witnessed. Does not lose control of bladder. Does not bite his tongue.  Pt had several ministrokes > 10 yrs ago. Carotids per the patient normal.  Past Medical History:  Diagnosis Date  . Adult BMI 28.0-28.9 kg/sq m   . 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 Ataxic gait 06/04/2019  . Essential hypertension   . Impaired fasting glucose   . Mini stroke (HCC)   . Mixed hyperlipidemia   . Other rosacea   . Vertigo    Past Surgical History:  Procedure Laterality Date  . ANAL FISSURE REPAIR    . TONSILLECTOMY      Family History  Problem Relation Age of Onset  . Alzheimer's disease Mother   . Cancer Paternal Grandfather   lives alone Social History   Socioeconomic History  . Marital status: Single    Spouse name: Not on file  . Number of children: Not on file  . Years of education: Not on file  . Highest education level: Some college, no degree  Occupational History  . Occupation: Retired  Tobacco Use  . Smoking status: Former Smoker    Packs/day: 3.00    Years: 66.00    Pack years: 198.00    Types: Cigarettes    Quit date: 01/05/1999    Years since quitting: 20.8  . Smokeless tobacco: Never Used  Substance and Sexual Activity  . Alcohol use: Not Currently  . Drug use: Never  . Sexual activity: Not on file  Other Topics Concern  . Not on file  Social History Narrative   Patient is right-handed. He lives  alone in a 2 story house with a basement.   Social Determinants of Health   Financial Resource Strain:   . Difficulty of Paying Living Expenses: Not on file  Food Insecurity:   . Worried About Programme researcher, broadcasting/film/video in the Last Year: Not on file  . Ran Out of Food in the Last Year: Not on file  Transportation Needs:   . Lack of Transportation (Medical): Not on file  . Lack of Transportation (Non-Medical): Not on file  Physical Activity:   . Days of Exercise per Week: Not on file  . Minutes of Exercise per Session: Not on file  Stress:   . Feeling of Stress : Not on file  Social Connections:   . Frequency of Communication with Friends and Family: Not on file  . Frequency of Social Gatherings with Friends and Family: Not on file  . Attends Religious Services: Not on file  . Active Member of Clubs or Organizations: Not on file  . Attends Banker Meetings: Not on file  . Marital Status: Not on file    Review of Systems  Constitutional: Negative for chills, fatigue and fever.  HENT: Negative for congestion, ear pain and sore throat.   Respiratory: Negative for cough and  shortness of breath.   Cardiovascular: Negative for chest pain and palpitations.  Gastrointestinal: Negative for abdominal pain, constipation, diarrhea, nausea and vomiting.  Genitourinary: Negative for dysuria.  Musculoskeletal: Positive for gait problem.  Neurological: Positive for dizziness. Negative for weakness and headaches.  Psychiatric/Behavioral: Negative for dysphoric mood. The patient is not nervous/anxious.      Objective:  BP 128/82   Pulse 65   Temp (!) 97.5 F (36.4 C)   Ht 5\' 8"  (1.727 m)   Wt 187 lb (84.8 kg)   SpO2 99%   BMI 28.43 kg/m   BP/Weight 11/01/2019 07/23/2019 08/23/2017  Systolic BP 128 130 114  Diastolic BP 82 84 76  Wt. (Lbs) 187 178.4 188  BMI 28.43 27.13 28.59    Physical Exam Constitutional:      Appearance: Normal appearance.  Cardiovascular:     Rate  and Rhythm: Regular rhythm.     Pulses: Normal pulses.     Heart sounds: Normal heart sounds.  Pulmonary:     Effort: Pulmonary effort is normal.     Breath sounds: Normal breath sounds.  Musculoskeletal:     Cervical back: Normal range of motion and neck supple.  Neurological:     Mental Status: He is alert and oriented to person, place, and time.     Gait: Gait abnormal.     Comments: Positive rhombergs.  Positive dysmetria Negative pronator drift.   Psychiatric:        Mood and Affect: Mood normal.        Behavior: Behavior normal.   Assessment and Plan:  1. Essential hypertension Blood pressure off medications normal range - Comprehensive metabolic panel   2. Impaired fasting glucose No medication currently - Hemoglobin A1c - CBC with Differential/Platelet  3. Mixed hyperlipidemia No medication currently - Lipid panel  4. Need for immunization against influenza - Flu Vaccine QUAD High Dose(Fluad)  5. Ataxia If labs normal, recommend MRI brain. - B12 and Folate Panel - Methylmalonic acid, serum  Follow-up:  An After Visit Summary was printed and given to the patient.  08/25/2017 [facility] [phone]

## 2019-11-04 ENCOUNTER — Encounter: Payer: Self-pay | Admitting: Family Medicine

## 2019-11-06 ENCOUNTER — Other Ambulatory Visit: Payer: Self-pay

## 2019-11-06 DIAGNOSIS — R270 Ataxia, unspecified: Secondary | ICD-10-CM

## 2019-11-06 LAB — COMPREHENSIVE METABOLIC PANEL
ALT: 37 IU/L (ref 0–44)
AST: 32 IU/L (ref 0–40)
Albumin/Globulin Ratio: 1.9 (ref 1.2–2.2)
Albumin: 5 g/dL — ABNORMAL HIGH (ref 3.7–4.7)
Alkaline Phosphatase: 101 IU/L (ref 44–121)
BUN/Creatinine Ratio: 7 — ABNORMAL LOW (ref 10–24)
BUN: 6 mg/dL — ABNORMAL LOW (ref 8–27)
Bilirubin Total: 0.7 mg/dL (ref 0.0–1.2)
CO2: 24 mmol/L (ref 20–29)
Calcium: 10 mg/dL (ref 8.6–10.2)
Chloride: 101 mmol/L (ref 96–106)
Creatinine, Ser: 0.91 mg/dL (ref 0.76–1.27)
GFR calc Af Amer: 98 mL/min/{1.73_m2} (ref >59)
GFR calc non Af Amer: 84 mL/min/{1.73_m2} (ref >59)
Globulin, Total: 2.7 g/dL (ref 1.5–4.5)
Glucose: 100 mg/dL — ABNORMAL HIGH (ref 65–99)
Potassium: 4.9 mmol/L (ref 3.5–5.2)
Sodium: 140 mmol/L (ref 134–144)
Total Protein: 7.7 g/dL (ref 6.0–8.5)

## 2019-11-06 LAB — CBC WITH DIFFERENTIAL/PLATELET
Basophils Absolute: 0.1 10*3/uL (ref 0.0–0.2)
Basos: 1 %
EOS (ABSOLUTE): 0.1 10*3/uL (ref 0.0–0.4)
Eos: 1 %
Hematocrit: 52.7 % — ABNORMAL HIGH (ref 37.5–51.0)
Hemoglobin: 18.1 g/dL — ABNORMAL HIGH (ref 13.0–17.7)
Immature Grans (Abs): 0 10*3/uL (ref 0.0–0.1)
Immature Granulocytes: 1 %
Lymphocytes Absolute: 1.7 10*3/uL (ref 0.7–3.1)
Lymphs: 20 %
MCH: 31.4 pg (ref 26.6–33.0)
MCHC: 34.3 g/dL (ref 31.5–35.7)
MCV: 91 fL (ref 79–97)
Monocytes Absolute: 0.7 10*3/uL (ref 0.1–0.9)
Monocytes: 9 %
Neutrophils Absolute: 5.9 10*3/uL (ref 1.4–7.0)
Neutrophils: 68 %
Platelets: 280 10*3/uL (ref 150–450)
RBC: 5.77 x10E6/uL (ref 4.14–5.80)
RDW: 12.6 % (ref 11.6–15.4)
WBC: 8.5 10*3/uL (ref 3.4–10.8)

## 2019-11-06 LAB — B12 AND FOLATE PANEL
Folate: 12.1 ng/mL (ref >3.0)
Vitamin B-12: 349 pg/mL (ref 232–1245)

## 2019-11-06 LAB — LIPID PANEL
Chol/HDL Ratio: 3.2 ratio (ref 0.0–5.0)
Cholesterol, Total: 159 mg/dL (ref 100–199)
HDL: 50 mg/dL (ref >39)
LDL Chol Calc (NIH): 90 mg/dL (ref 0–99)
Triglycerides: 105 mg/dL (ref 0–149)
VLDL Cholesterol Cal: 19 mg/dL (ref 5–40)

## 2019-11-06 LAB — METHYLMALONIC ACID, SERUM: Methylmalonic Acid: 330 nmol/L (ref 0–378)

## 2019-11-06 LAB — HEMOGLOBIN A1C
Est. average glucose Bld gHb Est-mCnc: 126 mg/dL
Hgb A1c MFr Bld: 6 % — ABNORMAL HIGH (ref 4.8–5.6)

## 2019-11-06 LAB — CARDIOVASCULAR RISK ASSESSMENT

## 2019-11-22 DIAGNOSIS — I10 Essential (primary) hypertension: Secondary | ICD-10-CM | POA: Diagnosis not present

## 2019-11-22 DIAGNOSIS — R270 Ataxia, unspecified: Secondary | ICD-10-CM | POA: Diagnosis not present

## 2019-11-22 DIAGNOSIS — I6782 Cerebral ischemia: Secondary | ICD-10-CM | POA: Diagnosis not present

## 2019-11-22 DIAGNOSIS — I616 Nontraumatic intracerebral hemorrhage, multiple localized: Secondary | ICD-10-CM | POA: Diagnosis not present

## 2019-12-25 ENCOUNTER — Encounter: Payer: Self-pay | Admitting: Family Medicine

## 2020-02-07 ENCOUNTER — Other Ambulatory Visit: Payer: Self-pay

## 2020-02-07 ENCOUNTER — Ambulatory Visit (INDEPENDENT_AMBULATORY_CARE_PROVIDER_SITE_OTHER): Payer: Medicare Other | Admitting: Family Medicine

## 2020-02-07 VITALS — BP 128/68 | HR 84 | Temp 97.7°F | Resp 16 | Ht 68.0 in | Wt 193.0 lb

## 2020-02-07 DIAGNOSIS — I6529 Occlusion and stenosis of unspecified carotid artery: Secondary | ICD-10-CM

## 2020-02-07 DIAGNOSIS — E782 Mixed hyperlipidemia: Secondary | ICD-10-CM

## 2020-02-07 DIAGNOSIS — I639 Cerebral infarction, unspecified: Secondary | ICD-10-CM

## 2020-02-07 DIAGNOSIS — I10 Essential (primary) hypertension: Secondary | ICD-10-CM | POA: Diagnosis not present

## 2020-02-07 DIAGNOSIS — I6389 Other cerebral infarction: Secondary | ICD-10-CM

## 2020-02-07 DIAGNOSIS — R7301 Impaired fasting glucose: Secondary | ICD-10-CM | POA: Diagnosis not present

## 2020-02-07 DIAGNOSIS — I69393 Ataxia following cerebral infarction: Secondary | ICD-10-CM

## 2020-02-07 NOTE — Progress Notes (Signed)
Subjective:  Patient ID: Jeremiah Russell, male    DOB: [birth year]  Age: 70 y.o. MRN: [id number]  Chief Complaint  Patient presents with  . Hyperlipidemia  . Gait Problem    HPI Ataxia secondary to cerebellar infarction which occurred 25 years ago. Worsened about 8 months ago. Is no longer driving. NO falls. Memory is good.  Baby aspirin 81 mg once daily. Had MRI in 11/2019 which showed moderate atrophy and extensive microvascular ischemic changes. . We never received mri report. Carotid US done in 2019 showed carotid stenosis and pt is supposed to have a repeat. Microhemorrhages in multiple areas concerning for poor hypertenson control. Prediabetes: a1c was 6.  Hyperlipidemia: on crestor 10 mg once daily.  Current Outpatient Medications on File Prior to Visit  Medication Sig Dispense Refill  . aspirin 81 MG chewable tablet Chew by mouth daily.    . rosuvastatin (CRESTOR) 10 MG tablet Take 1 tablet (10 mg total) by mouth daily. 90 tablet 3   No current facility-administered medications on file prior to visit.   Past Medical History:  Diagnosis Date  . Adult BMI 28.0-28.9 kg/sq m   . 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 Ataxic gait 06/04/2019  . Essential hypertension   . Impaired fasting glucose   . Mini stroke (HCC)   . Mixed hyperlipidemia   . Other rosacea   . Vertigo    Past Surgical History:  Procedure Laterality Date  . ANAL FISSURE REPAIR    . TONSILLECTOMY      Family History  Problem Relation Age of Onset  . Alzheimer's disease Mother   . Cancer Paternal Grandfather    Social History   Socioeconomic History  . Marital status: Single    Spouse name: Not on file  . Number of children: Not on file  . Years of education: Not on file  . Highest education level: Some college, no degree  Occupational History  . Occupation: Retired  Tobacco Use  . Smoking status: Former Smoker    Packs/day: 3.00    Years: 66.00    Pack years: 198.00    Types:  Cigarettes    Quit date: 01/05/1999    Years since quitting: 21.1  . Smokeless tobacco: Never Used  Substance and Sexual Activity  . Alcohol use: Not Currently  . Drug use: Never  . Sexual activity: Not on file  Other Topics Concern  . Not on file  Social History Narrative   Patient is right-handed. He lives alone in a 2 story house with a basement.   Social Determinants of Health   Financial Resource Strain: Not on file  Food Insecurity: Not on file  Transportation Needs: Not on file  Physical Activity: Not on file  Stress: Not on file  Social Connections: Not on file    Review of Systems  Constitutional: Negative for chills and fever.  HENT: Positive for rhinorrhea. Negative for congestion and sore throat.   Respiratory: Negative for cough and shortness of breath.   Cardiovascular: Negative for chest pain and palpitations.  Gastrointestinal: Negative for abdominal pain, constipation, diarrhea, nausea and vomiting.  Genitourinary: Negative for dysuria and urgency.  Musculoskeletal: Negative for arthralgias, back pain and myalgias.  Neurological: Positive for dizziness. Negative for headaches.  Psychiatric/Behavioral: Negative for dysphoric mood. The patient is not nervous/anxious.      Objective:  BP 128/68   Pulse 84   Temp 97.7 F (36.5 C)   Resp 16  Ht 5\' 8"  (1.727 m)   Wt 193 lb (87.5 kg)   BMI 29.35 kg/m   BP/Weight 02/07/2020 11/01/2019 07/23/2019  Systolic BP 128 128 130  Diastolic BP 68 82 84  Wt. (Lbs) 193 187 178.4  BMI 29.35 28.43 27.13    Physical Exam Vitals reviewed.  Constitutional:      Appearance: Normal appearance.  Cardiovascular:     Rate and Rhythm: Normal rate and regular rhythm.     Heart sounds: Normal heart sounds.  Pulmonary:     Effort: Pulmonary effort is normal.     Breath sounds: Normal breath sounds.  Neurological:     Mental Status: He is alert.     Gait: Gait abnormal (imbalanced. Positive rhombergs. positive pronator  drift. ).     Diabetic Foot Exam - Simple   No data filed      Lab Results  Component Value Date   WBC 9.5 02/07/2020   HGB 17.8 (H) 02/07/2020   HCT 52.0 (H) 02/07/2020   PLT 282 02/07/2020   GLUCOSE 113 (H) 02/07/2020   CHOL 158 02/07/2020   TRIG 123 02/07/2020   HDL 47 02/07/2020   LDLCALC 89 02/07/2020   ALT 31 02/07/2020   AST 29 02/07/2020   NA 140 02/07/2020   K 5.2 02/07/2020   CL 100 02/07/2020   CREATININE 1.01 02/07/2020   BUN 9 02/07/2020   CO2 24 02/07/2020   TSH 1.110 07/23/2019   HGBA1C 6.3 (H) 02/07/2020   MICROALBUR 30 07/23/2019      Assessment & Plan:   1. Cerebrovascular accident (CVA) due to other mechanism (HCC) ON baby aspirin 81 mg daily.  - ECHOCARDIOGRAM COMPLETE - 07/25/2019 Carotid Bilateral  2. Mixed hyperlipidemia Continue on crestor.  Low fat diet.  - CBC with Differential/Platelet - Comprehensive metabolic panel - Lipid panel  3. Essential hypertension At goal without medicine  4. Impaired fasting glucose Worsening. Start on metformin 500 mg once daily. - Hemoglobin A1c  5. Stenosis of carotid artery, unspecified laterality Check carotid US  6. Ataxia due to old cerebrovascular accident (CVA)  Unable to drive. 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  Pt also has decreasing size of brain which can occur with dementia although currently MMSE is 30/30.  Secondary to stroke.   Orders Placed This Encounter  Procedures  . US Carotid Bilateral  . CBC with Differential/Platelet  . Comprehensive metabolic panel  . Hemoglobin A1c  . Lipid panel  . Cardiovascular Risk Assessment  . ECHOCARDIOGRAM COMPLETE     Follow-up: Return in about 3 months (around 05/06/2020) for fasting.  An After Visit Summary was printed and given to the patient.  07/06/2020, MD [facility] [phone]

## 2020-02-08 LAB — COMPREHENSIVE METABOLIC PANEL
ALT: 31 IU/L (ref 0–44)
AST: 29 IU/L (ref 0–40)
Albumin/Globulin Ratio: 1.5 (ref 1.2–2.2)
Albumin: 4.5 g/dL (ref 3.7–4.7)
Alkaline Phosphatase: 98 IU/L (ref 44–121)
BUN/Creatinine Ratio: 9 — ABNORMAL LOW (ref 10–24)
BUN: 9 mg/dL (ref 8–27)
Bilirubin Total: 0.7 mg/dL (ref 0.0–1.2)
CO2: 24 mmol/L (ref 20–29)
Calcium: 9.9 mg/dL (ref 8.6–10.2)
Chloride: 100 mmol/L (ref 96–106)
Creatinine, Ser: 1.01 mg/dL (ref 0.76–1.27)
GFR calc Af Amer: 86 mL/min/{1.73_m2} (ref >59)
GFR calc non Af Amer: 74 mL/min/{1.73_m2} (ref >59)
Globulin, Total: 3 g/dL (ref 1.5–4.5)
Glucose: 113 mg/dL — ABNORMAL HIGH (ref 65–99)
Potassium: 5.2 mmol/L (ref 3.5–5.2)
Sodium: 140 mmol/L (ref 134–144)
Total Protein: 7.5 g/dL (ref 6.0–8.5)

## 2020-02-08 LAB — HEMOGLOBIN A1C
Est. average glucose Bld gHb Est-mCnc: 134 mg/dL
Hgb A1c MFr Bld: 6.3 % — ABNORMAL HIGH (ref 4.8–5.6)

## 2020-02-08 LAB — CBC WITH DIFFERENTIAL/PLATELET
Basophils Absolute: 0.1 10*3/uL (ref 0.0–0.2)
Basos: 1 %
EOS (ABSOLUTE): 0.1 10*3/uL (ref 0.0–0.4)
Eos: 1 %
Hematocrit: 52 % — ABNORMAL HIGH (ref 37.5–51.0)
Hemoglobin: 17.8 g/dL — ABNORMAL HIGH (ref 13.0–17.7)
Immature Grans (Abs): 0.1 10*3/uL (ref 0.0–0.1)
Immature Granulocytes: 1 %
Lymphocytes Absolute: 2.1 10*3/uL (ref 0.7–3.1)
Lymphs: 22 %
MCH: 31.8 pg (ref 26.6–33.0)
MCHC: 34.2 g/dL (ref 31.5–35.7)
MCV: 93 fL (ref 79–97)
Monocytes Absolute: 1 10*3/uL — ABNORMAL HIGH (ref 0.1–0.9)
Monocytes: 10 %
Neutrophils Absolute: 6.2 10*3/uL (ref 1.4–7.0)
Neutrophils: 65 %
Platelets: 282 10*3/uL (ref 150–450)
RBC: 5.59 x10E6/uL (ref 4.14–5.80)
RDW: 12.2 % (ref 11.6–15.4)
WBC: 9.5 10*3/uL (ref 3.4–10.8)

## 2020-02-08 LAB — CARDIOVASCULAR RISK ASSESSMENT

## 2020-02-08 LAB — LIPID PANEL
Chol/HDL Ratio: 3.4 ratio (ref 0.0–5.0)
Cholesterol, Total: 158 mg/dL (ref 100–199)
HDL: 47 mg/dL (ref >39)
LDL Chol Calc (NIH): 89 mg/dL (ref 0–99)
Triglycerides: 123 mg/dL (ref 0–149)
VLDL Cholesterol Cal: 22 mg/dL (ref 5–40)

## 2020-02-12 ENCOUNTER — Ambulatory Visit: Payer: Medicare Other

## 2020-02-12 ENCOUNTER — Other Ambulatory Visit: Payer: Self-pay

## 2020-02-12 MED ORDER — METFORMIN HCL 500 MG PO TABS: 500.0000 mg | ORAL_TABLET | Freq: Every day | ORAL | 0 refills | Status: DC

## 2020-02-14 ENCOUNTER — Ambulatory Visit: Payer: Medicare Other

## 2020-02-25 ENCOUNTER — Telehealth: Payer: Self-pay

## 2020-02-25 ENCOUNTER — Other Ambulatory Visit: Payer: Self-pay

## 2020-02-25 ENCOUNTER — Encounter: Payer: Self-pay | Admitting: Family Medicine

## 2020-02-25 DIAGNOSIS — R270 Ataxia, unspecified: Secondary | ICD-10-CM

## 2020-02-25 NOTE — Telephone Encounter (Signed)
      ECHO scheduled at [facility] Thursday, checking in at 1:15.  This will be done before his scheduled Carotid US at 3pm.  Patient is aware and the order has been faxed to [facility].  Creola Corn, LPN 95/18/84 1:66 PM

## 2020-02-28 DIAGNOSIS — I6521 Occlusion and stenosis of right carotid artery: Secondary | ICD-10-CM | POA: Diagnosis not present

## 2020-02-28 DIAGNOSIS — Z8673 Personal history of transient ischemic attack (TIA), and cerebral infarction without residual deficits: Secondary | ICD-10-CM | POA: Diagnosis not present

## 2020-02-28 DIAGNOSIS — I6389 Other cerebral infarction: Secondary | ICD-10-CM | POA: Diagnosis not present

## 2020-03-14 ENCOUNTER — Other Ambulatory Visit: Payer: Self-pay

## 2020-03-14 MED ORDER — ROSUVASTATIN CALCIUM 10 MG PO TABS: 10.0000 mg | ORAL_TABLET | Freq: Every day | ORAL | 1 refills | Status: DC

## 2020-05-22 ENCOUNTER — Other Ambulatory Visit: Payer: Self-pay

## 2020-05-22 ENCOUNTER — Ambulatory Visit (INDEPENDENT_AMBULATORY_CARE_PROVIDER_SITE_OTHER): Payer: Medicare Other | Admitting: Family Medicine

## 2020-05-22 ENCOUNTER — Ambulatory Visit (INDEPENDENT_AMBULATORY_CARE_PROVIDER_SITE_OTHER): Payer: Medicare Other

## 2020-05-22 ENCOUNTER — Encounter: Payer: Self-pay | Admitting: Family Medicine

## 2020-05-22 VITALS — BP 134/70 | HR 72 | Temp 97.8°F | Resp 16 | Ht 68.0 in

## 2020-05-22 DIAGNOSIS — L570 Actinic keratosis: Secondary | ICD-10-CM

## 2020-05-22 DIAGNOSIS — I6529 Occlusion and stenosis of unspecified carotid artery: Secondary | ICD-10-CM

## 2020-05-22 DIAGNOSIS — R7301 Impaired fasting glucose: Secondary | ICD-10-CM | POA: Diagnosis not present

## 2020-05-22 DIAGNOSIS — I69393 Ataxia following cerebral infarction: Secondary | ICD-10-CM

## 2020-05-22 DIAGNOSIS — Z23 Encounter for immunization: Secondary | ICD-10-CM | POA: Diagnosis not present

## 2020-05-22 DIAGNOSIS — Z6829 Body mass index (BMI) 29.0-29.9, adult: Secondary | ICD-10-CM

## 2020-05-22 DIAGNOSIS — E782 Mixed hyperlipidemia: Secondary | ICD-10-CM | POA: Diagnosis not present

## 2020-05-22 NOTE — Progress Notes (Signed)
Subjective:  Patient ID: Jeremiah Russell, male    DOB: July 16, 1950  Age: 70 y.o. MRN: [id number]  Chief Complaint  Patient presents with  . Hyperlipidemia  . Gait Problem    HPI Hyperlipidemia: On rosuvastatin and aspirin. History of strokes. Having gait issues. Carotid doppler - rt sided carotid stenosis 50-69%. Left carotid is normal.  Ataxia. Pt ran out of medicines. Needs crestor 10 mg once daily and aspirin 81 mg once daily.  Prediabetes: Eats healthy. Ran out of metformin.   Current Outpatient Medications on File Prior to Visit  Medication Sig Dispense Refill  . aspirin 81 MG chewable tablet Chew by mouth daily.    . rosuvastatin (CRESTOR) 10 MG tablet Take 1 tablet (10 mg total) by mouth daily. 90 tablet 1   No current facility-administered medications on file prior to visit.   Past Medical History:  Diagnosis Date  . Adult BMI 28.0-28.9 kg/sq m   . 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 Ataxic gait 06/04/2019  . Essential hypertension   . Impaired fasting glucose   . Mini stroke (HCC)   . Mixed hyperlipidemia   . Other rosacea   . Stroke [facility])    cerebellar  . Vertigo    Past Surgical History:  Procedure Laterality Date  . ANAL FISSURE REPAIR    . TONSILLECTOMY      Family History  Problem Relation Age of Onset  . Alzheimer's disease Mother   . Cancer Paternal Grandfather    Social History   Socioeconomic History  . Marital status: Single    Spouse name: Not on file  . Number of children: Not on file  . Years of education: Not on file  . Highest education level: Some college, no degree  Occupational History  . Occupation: Retired  Tobacco Use  . Smoking status: Former Smoker    Packs/day: 3.00    Years: 66.00    Pack years: 198.00    Types: Cigarettes    Quit date: 01/05/1999    Years since quitting: 21.4  . Smokeless tobacco: Never Used  Substance and Sexual Activity  . Alcohol use: Not Currently  . Drug use: Never  . Sexual  activity: Not on file  Other Topics Concern  . Not on file  Social History Narrative   Patient is right-handed. He lives alone in a 2 story house with a basement.   Social Determinants of Health   Financial Resource Strain: Not on file  Food Insecurity: Not on file  Transportation Needs: Not on file  Physical Activity: Not on file  Stress: Not on file  Social Connections: Not on file    Review of Systems  Constitutional: Negative for chills and fever.  HENT: Positive for rhinorrhea. Negative for congestion and sore throat.   Respiratory: Negative for cough and shortness of breath.   Cardiovascular: Negative for chest pain and palpitations.  Gastrointestinal: Negative for abdominal pain, constipation, diarrhea, nausea and vomiting.  Genitourinary: Negative for dysuria and urgency.  Musculoskeletal: Positive for neck pain. Negative for arthralgias, back pain and myalgias.  Neurological: Negative for dizziness and headaches.  Psychiatric/Behavioral: Negative for dysphoric mood. The patient is not nervous/anxious.      Objective:  BP 134/70   Pulse 72   Temp 97.8 F (36.6 C)   Resp 16   Ht 5\' 8"  (1.727 m)   BMI 29.35 kg/m   BP/Weight 05/22/2020 02/07/2020 11/01/2019  Systolic BP 134 128 128  Diastolic BP  70 68 82  Wt. (Lbs) - 193 187  BMI 29.35 29.35 28.43    Physical Exam Constitutional:      Appearance: Normal appearance.  HENT:     Right Ear: Tympanic membrane, ear canal and external ear normal.     Left Ear: Tympanic membrane, ear canal and external ear normal.     Nose: Nose normal. No congestion or rhinorrhea.     Mouth/Throat:     Mouth: Mucous membranes are moist.     Pharynx: No oropharyngeal exudate or posterior oropharyngeal erythema.  Cardiovascular:     Rate and Rhythm: Normal rate and regular rhythm.     Heart sounds: Normal heart sounds.  Pulmonary:     Effort: Pulmonary effort is normal. No respiratory distress.     Breath sounds: Normal breath  sounds. No wheezing, rhonchi or rales.  Abdominal:     General: Bowel sounds are normal.     Palpations: Abdomen is soft.     Tenderness: There is no abdominal tenderness.  Lymphadenopathy:     Cervical: No cervical adenopathy.  Skin:    Comments: Actinic keratosis on BL arms.   Neurological:     Mental Status: He is alert and oriented to person, place, and time.     Coordination: Coordination abnormal (Positive rhombergs. wide based gait.).     Gait: Gait abnormal.  Psychiatric:        Mood and Affect: Mood normal.        Behavior: Behavior normal.     Diabetic Foot Exam - Simple   No data filed      Lab Results  Component Value Date   WBC 7.3 05/22/2020   HGB 17.4 05/22/2020   HCT 48.7 05/22/2020   PLT 263 05/22/2020   GLUCOSE 91 05/22/2020   CHOL 243 (H) 05/22/2020   TRIG 171 (H) 05/22/2020   HDL 42 05/22/2020   LDLCALC 169 (H) 05/22/2020   ALT 25 05/22/2020   AST 20 05/22/2020   NA 140 05/22/2020   K 4.7 05/22/2020   CL 101 05/22/2020   CREATININE 0.95 05/22/2020   BUN 9 05/22/2020   CO2 25 05/22/2020   TSH 1.110 07/23/2019   HGBA1C 5.9 (H) 05/22/2020   MICROALBUR 30 07/23/2019   Assessment & Plan:   1. Mixed hyperlipidemia Restart crestor.  Continue to work on eating a healthy diet and exercise.  Labs drawn today.  - CBC with Differential/Platelet - Comprehensive metabolic panel - Lipid panel  2. Impaired fasting glucose Recommend continue to work on eating healthy diet and exercise. Restart metformin. - Hemoglobin A1c  3. Stenosis of carotid artery, unspecified laterality Restart on crestor and aspirin.  4. Ataxia due to old cerebrovascular accident (CVA) Restart on crestor and aspirin.  No new strokes. Imbalance is due to previous strokes.  - Ambulatory referral to Home Health  5. AK (actinic keratosis) Avoid sun exposure.   6. BMI 29.0-29.9,adult Recommend continue to work on eating healthy diet and exercise.  Orders Placed This  Encounter  Procedures  . CBC with Differential/Platelet  . Comprehensive metabolic panel  . Hemoglobin A1c  . Lipid panel  . Cardiovascular Risk Assessment  . Ambulatory referral to Home Health     Follow-up: Return in about 3 months (around 08/22/2020) for fasting.  An After Visit Summary was printed and given to the patient.  Blane Ohara, MD [facility] [phone]

## 2020-05-22 NOTE — Patient Instructions (Signed)
Restart crestor.  Continue aspirin daily.  I would like to:   Order echocardiogram.  Order dermatology referral.   Order neurology referral.  Please let me know when to proceed with these.  I am ordering home health care physical therapy to come out.

## 2020-05-23 ENCOUNTER — Other Ambulatory Visit: Payer: Self-pay | Admitting: Family Medicine

## 2020-05-23 LAB — COMPREHENSIVE METABOLIC PANEL
ALT: 25 IU/L (ref 0–44)
AST: 20 IU/L (ref 0–40)
Albumin/Globulin Ratio: 1.8 (ref 1.2–2.2)
Albumin: 4.6 g/dL (ref 3.7–4.7)
Alkaline Phosphatase: 102 IU/L (ref 44–121)
BUN/Creatinine Ratio: 9 — ABNORMAL LOW (ref 10–24)
BUN: 9 mg/dL (ref 8–27)
Bilirubin Total: 0.7 mg/dL (ref 0.0–1.2)
CO2: 25 mmol/L (ref 20–29)
Calcium: 9.5 mg/dL (ref 8.6–10.2)
Chloride: 101 mmol/L (ref 96–106)
Creatinine, Ser: 0.95 mg/dL (ref 0.76–1.27)
Globulin, Total: 2.5 g/dL (ref 1.5–4.5)
Glucose: 91 mg/dL (ref 65–99)
Potassium: 4.7 mmol/L (ref 3.5–5.2)
Sodium: 140 mmol/L (ref 134–144)
Total Protein: 7.1 g/dL (ref 6.0–8.5)
eGFR: 85 mL/min/{1.73_m2} (ref >59)

## 2020-05-23 LAB — CBC WITH DIFFERENTIAL/PLATELET
Basophils Absolute: 0.1 10*3/uL (ref 0.0–0.2)
Basos: 1 %
EOS (ABSOLUTE): 0.1 10*3/uL (ref 0.0–0.4)
Eos: 2 %
Hematocrit: 48.7 % (ref 37.5–51.0)
Hemoglobin: 17.4 g/dL (ref 13.0–17.7)
Immature Grans (Abs): 0 10*3/uL (ref 0.0–0.1)
Immature Granulocytes: 1 %
Lymphocytes Absolute: 1.8 10*3/uL (ref 0.7–3.1)
Lymphs: 24 %
MCH: 32.6 pg (ref 26.6–33.0)
MCHC: 35.7 g/dL (ref 31.5–35.7)
MCV: 91 fL (ref 79–97)
Monocytes Absolute: 0.7 10*3/uL (ref 0.1–0.9)
Monocytes: 10 %
Neutrophils Absolute: 4.6 10*3/uL (ref 1.4–7.0)
Neutrophils: 62 %
Platelets: 263 10*3/uL (ref 150–450)
RBC: 5.33 x10E6/uL (ref 4.14–5.80)
RDW: 12.7 % (ref 11.6–15.4)
WBC: 7.3 10*3/uL (ref 3.4–10.8)

## 2020-05-23 LAB — LIPID PANEL
Chol/HDL Ratio: 5.8 ratio — ABNORMAL HIGH (ref 0.0–5.0)
Cholesterol, Total: 243 mg/dL — ABNORMAL HIGH (ref 100–199)
HDL: 42 mg/dL (ref >39)
LDL Chol Calc (NIH): 169 mg/dL — ABNORMAL HIGH (ref 0–99)
Triglycerides: 171 mg/dL — ABNORMAL HIGH (ref 0–149)
VLDL Cholesterol Cal: 32 mg/dL (ref 5–40)

## 2020-05-23 LAB — HEMOGLOBIN A1C
Est. average glucose Bld gHb Est-mCnc: 123 mg/dL
Hgb A1c MFr Bld: 5.9 % — ABNORMAL HIGH (ref 4.8–5.6)

## 2020-05-23 LAB — CARDIOVASCULAR RISK ASSESSMENT

## 2020-05-27 MED ORDER — ROSUVASTATIN CALCIUM 10 MG PO TABS: 10.0000 mg | ORAL_TABLET | Freq: Every day | ORAL | 1 refills | Status: DC

## 2020-05-27 MED ORDER — METFORMIN HCL 500 MG PO TABS: 500.0000 mg | ORAL_TABLET | Freq: Every day | ORAL | 0 refills | Status: DC

## 2020-05-31 DIAGNOSIS — L719 Rosacea, unspecified: Secondary | ICD-10-CM | POA: Diagnosis not present

## 2020-05-31 DIAGNOSIS — I10 Essential (primary) hypertension: Secondary | ICD-10-CM | POA: Diagnosis not present

## 2020-05-31 DIAGNOSIS — E782 Mixed hyperlipidemia: Secondary | ICD-10-CM | POA: Diagnosis not present

## 2020-05-31 DIAGNOSIS — L570 Actinic keratosis: Secondary | ICD-10-CM | POA: Diagnosis not present

## 2020-06-03 ENCOUNTER — Telehealth: Payer: Self-pay

## 2020-06-03 ENCOUNTER — Other Ambulatory Visit: Payer: Self-pay | Admitting: Family Medicine

## 2020-06-03 NOTE — Telephone Encounter (Signed)
Vinnie Langton from [facility] called for approval for plan of care on Jeremiah Russell's.  Dr. Sedalia Muta gave approval.  She is concerned that he may be exhibiting symptoms of parkinson's.  He has been seen by Neurology in the past and Dr. Sedalia Muta would like to get him back in.  Jeremiah Russell is going to check with family about getting transportation before he will allow [place] to schedule the appointment.

## 2020-08-19 ENCOUNTER — Other Ambulatory Visit: Payer: Self-pay | Admitting: Family Medicine

## 2020-08-19 DIAGNOSIS — R7301 Impaired fasting glucose: Secondary | ICD-10-CM

## 2020-08-28 ENCOUNTER — Ambulatory Visit: Payer: Medicare Other | Admitting: Family Medicine

## 2021-02-26 ENCOUNTER — Other Ambulatory Visit: Payer: Self-pay

## 2021-02-26 ENCOUNTER — Ambulatory Visit (INDEPENDENT_AMBULATORY_CARE_PROVIDER_SITE_OTHER): Payer: Medicare Other | Admitting: Family Medicine

## 2021-02-26 ENCOUNTER — Ambulatory Visit (INDEPENDENT_AMBULATORY_CARE_PROVIDER_SITE_OTHER): Payer: Medicare Other

## 2021-02-26 VITALS — BP 120/78 | HR 80 | Temp 98.0°F | Resp 18 | Ht 67.0 in | Wt 186.0 lb

## 2021-02-26 DIAGNOSIS — R0781 Pleurodynia: Secondary | ICD-10-CM

## 2021-02-26 DIAGNOSIS — E782 Mixed hyperlipidemia: Secondary | ICD-10-CM

## 2021-02-26 DIAGNOSIS — I69393 Ataxia following cerebral infarction: Secondary | ICD-10-CM | POA: Diagnosis not present

## 2021-02-26 DIAGNOSIS — I6529 Occlusion and stenosis of unspecified carotid artery: Secondary | ICD-10-CM

## 2021-02-26 DIAGNOSIS — W1800XA Striking against unspecified object with subsequent fall, initial encounter: Secondary | ICD-10-CM

## 2021-02-26 DIAGNOSIS — R0789 Other chest pain: Secondary | ICD-10-CM

## 2021-02-26 DIAGNOSIS — Z23 Encounter for immunization: Secondary | ICD-10-CM

## 2021-02-26 DIAGNOSIS — R7301 Impaired fasting glucose: Secondary | ICD-10-CM | POA: Diagnosis not present

## 2021-02-26 DIAGNOSIS — I10 Essential (primary) hypertension: Secondary | ICD-10-CM

## 2021-02-26 NOTE — Progress Notes (Signed)
° °  Covid-19 Vaccination Clinic  Name:  Jeremiah Russell    MRN: [id number] DOB: August 07, 1950  02/26/2021  Jeremiah Russell was observed post Covid-19 immunization for 15 minutes without incident. He was provided with Vaccine Information Sheet and instruction to access the V-Safe system.   Jeremiah Russell was instructed to call 911 with any severe reactions post vaccine: Difficulty breathing  Swelling of face and throat  A fast heartbeat  A bad rash all over body  Dizziness and weakness   Immunizations Administered     Name Date Dose VIS Date Route   Pfizer Covid-19 Vaccine Bivalent Booster 02/26/2021  2:59 PM 0.3 mL 09/03/2020 Intramuscular   Manufacturer: [employer], [employer]   Lot: [id number]   NDC: [id number]

## 2021-02-26 NOTE — Progress Notes (Signed)
Subjective:  Patient ID: Jeremiah Russell, male    DOB: July 13, 1950  Age: 71 y.o. MRN: [id number]  Chief Complaint  Patient presents with   Hypertension   Hyperlipidemia    HPI PreDiabetes:  Most recent A1C: 5.9 Current medications: on metformin 500 mg once daily.  Hyperlipidemia: Current medications: ran out.   Hypertension: No medicines.  Diet: healthy Exercise: [place] Monday. Complains of left rib pain/contusion. Hit on chest. Hurts to take a deep breath.    No current outpatient medications on file prior to visit.   No current facility-administered medications on file prior to visit.   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   Ataxic gait 06/04/2019   Essential hypertension    Impaired fasting glucose    Mini stroke    Mixed hyperlipidemia    Other rosacea    Stroke [facility])    cerebellar   Vertigo    Past Surgical History:  Procedure Laterality Date   ANAL FISSURE REPAIR     TONSILLECTOMY      Family History  Problem Relation Age of Onset   Alzheimer's disease Mother    Cancer Paternal Grandfather    Social History   Socioeconomic History   Marital status: Single    Spouse name: Not on file   Number of children: Not on file   Years of education: Not on file   Highest education level: Some college, no degree  Occupational History   Occupation: Retired  Tobacco Use   Smoking status: Former    Packs/day: 3.00    Years: 66.00    Pack years: 198.00    Types: Cigarettes    Quit date: 01/05/1999    Years since quitting: 22.1   Smokeless tobacco: Never  Substance and Sexual Activity   Alcohol use: Not Currently   Drug use: Never   Sexual activity: Not on file  Other Topics Concern   Not on file  Social History Narrative   Patient is right-handed. He lives alone in a 2 story house with a basement.   Social Determinants of Health   Financial Resource Strain: Not on  file  Food Insecurity: Not on file  Transportation Needs: Not on file  Physical Activity: Not on file  Stress: Not on file  Social Connections: Not on file    Review of Systems  Constitutional:  Negative for chills and fever.  HENT:  Negative for congestion, rhinorrhea and sore throat.   Respiratory:  Negative for cough and shortness of breath.   Cardiovascular:  Negative for chest pain and palpitations.  Gastrointestinal:  Negative for abdominal pain, constipation, diarrhea, nausea and vomiting.  Genitourinary:  Negative for dysuria and urgency.  Musculoskeletal:  Negative for arthralgias, back pain and myalgias.       Left ribcage pain   Neurological:  Positive for weakness and headaches. Negative for dizziness.  Psychiatric/Behavioral:  Negative for dysphoric mood. The patient is not nervous/anxious.     Objective:  BP 120/78    Pulse 80    Temp 98 F (36.7 C)    Resp 18    Ht 5\' 7"  (1.702 m)    Wt 186 lb (84.4 kg)    BMI 29.13 kg/m   BP/Weight 02/26/2021 0000000 [id number]  Systolic BP [id number] [id number] 0000000  Diastolic BP 78 70 68  Wt. (Lbs) 186 - 193  BMI 29.13 29.35 29.35  Physical Exam Vitals reviewed.  Constitutional:      Appearance: Normal appearance.  Neck:     Vascular: No carotid bruit.  Cardiovascular:     Rate and Rhythm: Normal rate and regular rhythm.     Heart sounds: Normal heart sounds.     Comments: Left lower rib tenderness. Pulmonary:     Effort: Pulmonary effort is normal.     Breath sounds: Normal breath sounds. No wheezing, rhonchi or rales.  Abdominal:     General: Bowel sounds are normal.     Palpations: Abdomen is soft.     Tenderness: There is no abdominal tenderness.  Neurological:     Mental Status: He is alert and oriented to person, place, and time.  Psychiatric:        Mood and Affect: Mood normal.        Behavior: Behavior normal.    Diabetic Foot Exam - Simple   No data filed      Lab Results  Component Value Date   WBC 9.6  02/26/2021   HGB 16.6 02/26/2021   HCT 47.7 02/26/2021   PLT 262 02/26/2021   GLUCOSE 106 (H) 02/26/2021   CHOL 211 (H) 02/26/2021   TRIG 149 02/26/2021   HDL 37 (L) 02/26/2021   LDLCALC 147 (H) 02/26/2021   ALT 33 02/26/2021   AST 48 (H) 02/26/2021   NA 134 02/26/2021   K 4.7 02/26/2021   CL 96 02/26/2021   CREATININE 0.99 02/26/2021   BUN 12 02/26/2021   CO2 23 02/26/2021   TSH 1.110 07/23/2019   HGBA1C 5.8 (H) 02/26/2021   MICROALBUR 30 07/23/2019      Assessment & Plan:   Problem List Items Addressed This Visit       Cardiovascular and Mediastinum   Essential hypertension    Medicines not needed.      Stenosis of carotid artery    Restart on crestor 10 mg before bed.  Recommend continue to work on eating healthy diet and exercise. Check lipid level.        Endocrine   Impaired fasting glucose    Check a1c.  Recommend continue to work on eating healthy diet and exercise.       Relevant Orders   Hemoglobin A1c (Completed)     Other   Mixed hyperlipidemia - Primary    Restart crestor 10 mg before bed.  Restart aspirin 81 mg daily.  Recommend continue to work on eating healthy diet and exercise. Check labs.       Relevant Orders   CBC with Differential/Platelet (Completed)   Comprehensive metabolic panel (Completed)   Lipid panel (Completed)   Ataxia due to old cerebrovascular accident (CVA)    Restart crestor and aspirin.       Need for immunization against influenza   Rib pain on left side    Recommend splint area.  Tylenol.  No evidence of lungs affected. Continue to take deep breaths to prevent pneumonia.      Fall on same level due to impact against object    Caution to help prevent falls due to ataxia.      .  No orders of the defined types were placed in this encounter.   Orders Placed This Encounter  Procedures   Flu Vaccine QUAD High Dose(Fluad)   CBC with Differential/Platelet   Comprehensive metabolic panel    Hemoglobin A1c   Lipid panel     Follow-up: Return in about 3 months (around  05/26/2021) for chronic fasting, awv (due now I think).  An After Visit Summary was printed and given to the patient.  Rochel Brome, MD [facility] [phone]

## 2021-02-26 NOTE — Patient Instructions (Addendum)
Rib pain: tylenol and splinting with pillow.

## 2021-02-27 LAB — CBC WITH DIFFERENTIAL/PLATELET
Basophils Absolute: 0.1 10*3/uL (ref 0.0–0.2)
Basos: 1 %
EOS (ABSOLUTE): 0 10*3/uL (ref 0.0–0.4)
Eos: 0 %
Hematocrit: 47.7 % (ref 37.5–51.0)
Hemoglobin: 16.6 g/dL (ref 13.0–17.7)
Immature Grans (Abs): 0.1 10*3/uL (ref 0.0–0.1)
Immature Granulocytes: 1 %
Lymphocytes Absolute: 1.2 10*3/uL (ref 0.7–3.1)
Lymphs: 12 %
MCH: 32.2 pg (ref 26.6–33.0)
MCHC: 34.8 g/dL (ref 31.5–35.7)
MCV: 93 fL (ref 79–97)
Monocytes Absolute: 1.3 10*3/uL — ABNORMAL HIGH (ref 0.1–0.9)
Monocytes: 13 %
Neutrophils Absolute: 7 10*3/uL (ref 1.4–7.0)
Neutrophils: 73 %
Platelets: 262 10*3/uL (ref 150–450)
RBC: 5.15 x10E6/uL (ref 4.14–5.80)
RDW: 12.6 % (ref 11.6–15.4)
WBC: 9.6 10*3/uL (ref 3.4–10.8)

## 2021-02-27 LAB — COMPREHENSIVE METABOLIC PANEL
ALT: 33 IU/L (ref 0–44)
AST: 48 IU/L — ABNORMAL HIGH (ref 0–40)
Albumin/Globulin Ratio: 1.4 (ref 1.2–2.2)
Albumin: 4.1 g/dL (ref 3.7–4.7)
Alkaline Phosphatase: 101 IU/L (ref 44–121)
BUN/Creatinine Ratio: 12 (ref 10–24)
BUN: 12 mg/dL (ref 8–27)
Bilirubin Total: 0.8 mg/dL (ref 0.0–1.2)
CO2: 23 mmol/L (ref 20–29)
Calcium: 9.3 mg/dL (ref 8.6–10.2)
Chloride: 96 mmol/L (ref 96–106)
Creatinine, Ser: 0.99 mg/dL (ref 0.76–1.27)
Globulin, Total: 3 g/dL (ref 1.5–4.5)
Glucose: 106 mg/dL — ABNORMAL HIGH (ref 70–99)
Potassium: 4.7 mmol/L (ref 3.5–5.2)
Sodium: 134 mmol/L (ref 134–144)
Total Protein: 7.1 g/dL (ref 6.0–8.5)
eGFR: 81 mL/min/{1.73_m2} (ref >59)

## 2021-02-27 LAB — LIPID PANEL
Chol/HDL Ratio: 5.7 ratio — ABNORMAL HIGH (ref 0.0–5.0)
Cholesterol, Total: 211 mg/dL — ABNORMAL HIGH (ref 100–199)
HDL: 37 mg/dL — ABNORMAL LOW (ref >39)
LDL Chol Calc (NIH): 147 mg/dL — ABNORMAL HIGH (ref 0–99)
Triglycerides: 149 mg/dL (ref 0–149)
VLDL Cholesterol Cal: 27 mg/dL (ref 5–40)

## 2021-02-27 LAB — HEMOGLOBIN A1C
Est. average glucose Bld gHb Est-mCnc: 120 mg/dL
Hgb A1c MFr Bld: 5.8 % — ABNORMAL HIGH (ref 4.8–5.6)

## 2021-03-02 NOTE — Progress Notes (Signed)
Blood count normal.  Liver function none level slightly abnormal.   Kidney function normal.  Cholesterol: LDL 147. Recommend statin: crestor 10 mg before bed.  HBA1C: 5.8.

## 2021-03-04 ENCOUNTER — Other Ambulatory Visit: Payer: Self-pay

## 2021-03-04 MED ORDER — ROSUVASTATIN CALCIUM 10 MG PO TABS: 10.0000 mg | ORAL_TABLET | Freq: Every day | ORAL | 3 refills | Status: DC

## 2021-03-04 NOTE — Telephone Encounter (Signed)
Spoke with patient, verbalized understanding and had no questions at this time. ? ?Patient is okay with starting crestor.  ?

## 2021-03-08 ENCOUNTER — Encounter: Payer: Self-pay | Admitting: Family Medicine

## 2021-03-08 DIAGNOSIS — W1800XA Striking against unspecified object with subsequent fall, initial encounter: Secondary | ICD-10-CM | POA: Insufficient documentation

## 2021-03-08 DIAGNOSIS — Z23 Encounter for immunization: Secondary | ICD-10-CM | POA: Insufficient documentation

## 2021-03-08 DIAGNOSIS — I6529 Occlusion and stenosis of unspecified carotid artery: Secondary | ICD-10-CM | POA: Insufficient documentation

## 2021-03-08 DIAGNOSIS — R0781 Pleurodynia: Secondary | ICD-10-CM | POA: Insufficient documentation

## 2021-03-08 NOTE — Assessment & Plan Note (Signed)
Restart crestor and aspirin.  ?

## 2021-03-08 NOTE — Assessment & Plan Note (Signed)
Check a1c.  Recommend continue to work on eating healthy diet and exercise.  

## 2021-03-08 NOTE — Assessment & Plan Note (Signed)
Medicines not needed. ?

## 2021-03-08 NOTE — Assessment & Plan Note (Signed)
Caution to help prevent falls due to ataxia.  ?

## 2021-03-08 NOTE — Assessment & Plan Note (Signed)
Restart crestor 10 mg before bed.  ?Restart aspirin 81 mg daily.  ?Recommend continue to work on eating healthy diet and exercise. ?Check labs.  ?

## 2021-03-08 NOTE — Assessment & Plan Note (Signed)
Recommend splint area.  ?Tylenol.  ?No evidence of lungs affected. Continue to take deep breaths to prevent pneumonia. ?

## 2021-03-08 NOTE — Assessment & Plan Note (Signed)
Restart on crestor 10 mg before bed.  ?Recommend continue to work on eating healthy diet and exercise. ?Check lipid level. ?

## 2021-03-09 ENCOUNTER — Encounter: Payer: Self-pay | Admitting: Nurse Practitioner

## 2021-03-09 ENCOUNTER — Other Ambulatory Visit: Payer: Self-pay

## 2021-03-09 ENCOUNTER — Ambulatory Visit (INDEPENDENT_AMBULATORY_CARE_PROVIDER_SITE_OTHER): Payer: Medicare Other | Admitting: Nurse Practitioner

## 2021-03-09 VITALS — BP 122/80 | HR 69 | Temp 97.7°F | Ht 68.0 in | Wt 182.0 lb

## 2021-03-09 DIAGNOSIS — Z0000 Encounter for general adult medical examination without abnormal findings: Secondary | ICD-10-CM

## 2021-03-09 NOTE — Progress Notes (Addendum)
Subjective:   Jeremiah Russell is a 71 y.o. male who presents for Medicare Annual/Subsequent preventive examination.  Review of Systems    Review of Systems  Constitutional:  Negative for chills, fever and malaise/fatigue.  HENT:  Negative for ear pain, sinus pain and sore throat.   Respiratory:  Negative for cough and shortness of breath.   Cardiovascular:  Negative for chest pain.  Musculoskeletal:  Positive for neck pain. Negative for myalgias.  Neurological:  Negative for headaches.   Cardiac Risk Factors include: advanced age (>90men, >68 women)     Objective:    Today's Vitals   03/09/21 1344  Pulse: 69  Temp: 97.7 F (36.5 C)  SpO2: 98%  Weight: 182 lb (82.6 kg)  Height: 5\' 8"  (1.727 m)   Body mass index is 27.67 kg/m.  Advanced Directives 03/09/2021  Does Patient Have a Medical Advance Directive? No  Would patient like information on creating a medical advance directive? Yes, information given    Current Medications (verified) Outpatient Encounter Medications as of 03/09/2021  Medication Sig   rosuvastatin (CRESTOR) 10 MG tablet Take 1 tablet (10 mg total) by mouth daily.   No facility-administered encounter medications on file as of 03/09/2021.    Allergies (verified) Patient has no known allergies.   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   Ataxic gait 06/04/2019   Essential hypertension    Impaired fasting glucose    Mini stroke    Mixed hyperlipidemia    Other rosacea    Stroke ([facility]CC)    cerebellar   Vertigo    Past Surgical History:  Procedure Laterality Date   ANAL FISSURE REPAIR     TONSILLECTOMY     Family History  Problem Relation Age of Onset   Alzheimer's disease Mother    Cancer Paternal Grandfather    Social History   Socioeconomic History   Marital status: Single    Spouse name: Not on file   Number of children: Not on file   Years of  education: Not on file   Highest education level: Some college, no degree  Occupational History   Occupation: Retired  Tobacco Use   Smoking status: Former    Packs/day: 3.00    Years: 66.00    Pack years: 198.00    Types: Cigarettes    Quit date: 01/05/1999    Years since quitting: 22.1   Smokeless tobacco: Never  Substance and Sexual Activity   Alcohol use: Not Currently   Drug use: Never   Sexual activity: Not on file  Other Topics Concern   Not on file  Social History Narrative   Patient is right-handed. He lives alone in a 2 story house with a basement.   Social Determinants of Health   Financial Resource Strain: Low Risk    Difficulty of Paying Living Expenses: Not hard at all  Food Insecurity: No Food Insecurity   Worried About 03/05/1999 in the Last Year: Never true   Ran Out of Food in the Last Year: Never true  Transportation Needs: No Transportation Needs   Lack of Transportation (Medical): No   Lack of Transportation (Non-Medical): No  Physical Activity: Inactive   Days of Exercise per Week: 0 days   Minutes of Exercise per Session: 0 min  Stress: No Stress Concern Present   Feeling of Stress : Not at all  Social Connections: Socially Isolated   Frequency of Communication with Friends and Family: Never   Frequency of Social Gatherings with Friends and Family: Never   Attends Religious Services: Never   Diplomatic Services operational officer: No   Attends Engineer, structural: Never   Marital Status: Never married    Tobacco Counseling Counseling given: former smoker   Clinical Intake:  Pre-visit preparation completed: No  Pain : No/denies pain     Nutritional Status: BMI 25 -29 Overweight Nutritional Risks: None Diabetes: No (Prediabetic)  How often do you need to have someone help you when you read instructions, pamphlets, or other written materials from your doctor or pharmacy?: 1 -  Never  Diabetic?Prediabetic  Interpreter Needed?: No      Activities of Daily Living In your present state of health, do you have any difficulty performing the following activities: 03/09/2021  Hearing? N  Vision? N  Difficulty concentrating or making decisions? N  Walking or climbing stairs? N  Dressing or bathing? N  Doing errands, shopping? N  Preparing Food and eating ? N  Using the Toilet? N  In the past six months, have you accidently leaked urine? N  Do you have problems with loss of bowel control? N  Managing your Medications? N  Managing your Finances? N  Housekeeping or managing your Housekeeping? N  Some recent data might be hidden    Patient Care Team: Blane Ohara, MD as PCP - General (Family Medicine)       Assessment:   This is a routine wellness examination for [name].  Hearing/Vision screen Overdue eye exam  Never had hearing screening, denies known hearing loss  Dietary issues and exercise activities discussed: Current Exercise Habits: The patient does not participate in regular exercise at present, Exercise limited by: cardiac condition(s)   Goals Addressed   Advance directives   Depression Screen PHQ 2/9 Scores 03/09/2021 05/22/2020 02/07/2020 07/23/2019  PHQ - 2 Score 0 0 0 0    Fall Risk Fall Risk  03/09/2021 05/22/2020 02/07/2020 11/01/2019 08/23/2017  Falls in the past year? 1 0 0 0 Yes  Number falls in past yr: 0 0 0 0 1  Injury with Fall? 0 0 0 0 No  Risk for fall due to : Impaired balance/gait - - Impaired vision;Impaired balance/gait -  Follow up Falls evaluation completed - - - -    FALL RISK PREVENTION PERTAINING TO THE HOME:  Any stairs in or around the home? Yes  If so, are there any without handrails? Yes  Home free of loose throw rugs in walkways, pet beds, electrical cords, etc? No  Adequate lighting in your home to reduce risk of falls? Yes   ASSISTIVE DEVICES UTILIZED TO PREVENT FALLS:  Life alert? Yes  Use of a cane, walker  or w/c? Yes  Grab bars in the bathroom? Yes  Shower chair or bench in shower? No  Elevated toilet seat or a handicapped toilet? No   TIMED UP AND GO:  Was the test performed? No .  Length of time to ambulate 10 feet:    Gait slow and steady with assistive device  Cognitive Function: MMSE - Mini Mental State Exam 02/07/2020  Orientation to time 5  Orientation to Place 5  Registration 3  Attention/ Calculation 5  Recall 3  Language- name 2 objects 2  Language- repeat 1  Language- follow 3 step command 3  Language- read & follow direction 1  Write a sentence  1  Copy design 1  Total score 30        Immunizations Immunization History  Administered Date(s) Administered   Fluad Quad(high Dose 65+) 11/01/2019, 02/26/2021   Influenza-Unspecified 09/13/2017   PFIZER Comirnaty(Gray Top)Covid-19 Tri-Sucrose Vaccine 05/22/2020   PFIZER(Purple Top)SARS-COV-2 Vaccination 05/09/2019, 05/23/2019   Pfizer Covid-19 Vaccine Bivalent Booster 22yrs & up 02/26/2021   Pneumococcal Conjugate-13 07/21/2015   Pneumococcal Polysaccharide-23 08/12/2016    TDAP status: Up to date  Flu Vaccine status: Up to date  Pneumococcal vaccine status: Up to date  Covid-19 vaccine status: Completed vaccines  Qualifies for Shingles Vaccine? Yes   Zostavax completed No   Shingrix Completed?: No.    Education has been provided regarding the importance of this vaccine. Patient has been advised to call insurance company to determine out of pocket expense if they have not yet received this vaccine. Advised may also receive vaccine at local pharmacy or Health Dept. Verbalized acceptance and understanding.  Screening Tests Health Maintenance  Topic Date Due   TETANUS/TDAP  Never done   Zoster Vaccines- Shingrix (1 of 2) Never done   Hepatitis C Screening  03/10/2022 (Originally 03/05/1968)   COLONOSCOPY (Pts 45-36yrs Insurance coverage will need to be confirmed)  09/22/2025   Pneumonia Vaccine 83+ Years old   Completed   INFLUENZA VACCINE  Completed   COVID-19 Vaccine  Completed   HPV VACCINES  Aged Out    Health Maintenance  Health Maintenance Due  Topic Date Due   TETANUS/TDAP  Never done   Zoster Vaccines- Shingrix (1 of 2) Never done    Colorectal cancer screening: Type of screening: Colonoscopy. Completed 09/23/2015. Repeat every 10 years  Lung Cancer Screening: (Low Dose CT Chest recommended if Age 2-80 years, 30 pack-year currently smoking OR have quit w/in 15years.) does qualify. Smoked age 11 until 2001. Quit 2001 Lung Cancer Screening Referral: [facility]  Additional Screening:  Hepatitis C Screening: does not qualify;   Vision Screening: Recommended annual ophthalmology exams for early detection of glaucoma and other disorders of the eye. Is the patient up to date with their annual eye exam?  No  Who is the provider or what is the name of the office in which the patient attends annual eye exams? Overdue If pt is not established with a provider, would they like to be referred to a provider to establish care? No .   Dental Screening: Recommended annual dental exams for proper oral hygiene  Community Resource Referral / Chronic Care Management: CRR required this visit?  No   CCM required this visit?  No      Plan:     I have personally reviewed and noted the following in the patients chart:   Medical and social history Use of alcohol, tobacco or illicit drugs  Current medications and supplements including opioid prescriptions. Patient is not currently taking opioid prescriptions. Functional ability and status Nutritional status Physical activity Advanced directives List of other physicians Hospitalizations, surgeries, and ER visits in previous 12 months Vitals Screenings to include cognitive, depression, and falls Referrals and appointments  In addition, I have reviewed and discussed with patient certain preventive protocols, quality metrics, and  best practice recommendations. A written personalized care plan for preventive services as well as general preventive health recommendations were provided to patient.    Signed Flonnie Hailstone, DNP   03/09/2021

## 2021-03-09 NOTE — Patient Instructions (Addendum)
Obtain Shingles vaccine at local pharmacy Continue medications Complete advance directive papers, have them notarized, return copy to Dr Cox's office on 05/28/21 next appt    Advance Directive Advance directives are legal documents that allow you to make decisions about your health care and medical treatment in case you become unable to communicate for yourself. Advance directives let your wishes be known to family, friends, and health care providers. Discussing and writing advance directives should happen over time rather than all at once. Advance directives can be changed and updated at any time. There are different types of advance directives, such as: Medical power of attorney. Living will. Do not resuscitate (DNR) order or do not attempt resuscitation (DNAR) order. Health care proxy and medical power of attorney A health care proxy is also called a health care agent. This person is appointed to make medical decisions for you when you are unable to make decisions for yourself. Generally, people ask a trusted friend or family member to act as their proxy and represent their preferences. Make sure you have an agreement with your trusted person to act as your proxy. A proxy may have to make a medical decision on your behalf if your wishes are not known. A medical power of attorney, also called a durable power of attorney for health care, is a legal document that names your health care proxy. Depending on the laws in your state, the document may need to be: Signed. Notarized. Dated. Copied. Witnessed. Incorporated into your medical record. You may also want to appoint a trusted person to manage your money in the event you are unable to do so. This is called a durable power of attorney for finances. It is a separate legal document from the durable power of attorney for health care. You may choose your health care proxy or someone different to act as your agent in money matters. If you do not  appoint a proxy, or there is a concern that the proxy is not acting in your best interest, a court may appoint a guardian to act on your behalf. Living will A living will is a set of instructions that state your wishes about medical care when you cannot express them yourself. Health care providers should keep a copy of your living will in your medical record. You may want to give a copy to family members or friends. To alert caregivers in case of an emergency, you can place a card in your wallet to let them know that you have a living will and where they can find it. A living will is used if you become: Terminally ill. Disabled. Unable to communicate or make decisions. The following decisions should be included in your living will: To use or not to use life support equipment, such as dialysis machines and breathing machines (ventilators). Whether you want a DNR or DNAR order. This tells health care providers not to use cardiopulmonary resuscitation (CPR) if breathing or heartbeat stops. To use or not to use tube feeding. To be given or not to be given food and fluids. Whether you want comfort (palliative) care when the goal becomes comfort rather than a cure. Whether you want to donate your organs and tissues. A living will does not give instructions for distributing your money and property if you should pass away. DNR or DNAR A DNR or DNAR order is a request not to have CPR in the event that your heart stops beating or you stop breathing. If a DNR or  DNAR order has not been made and shared, a health care provider will try to help any patient whose heart has stopped or who has stopped breathing. If you plan to have surgery, talk with your health care provider about how your DNR or DNAR order will be followed if problems occur. What if I do not have an advance directive? Some states assign family decision makers to act on your behalf if you do not have an advance directive. Each state has its own  laws about advance directives. You may want to check with your health care provider, attorney, or state representative about the laws in your state. Summary Advance directives are legal documents that allow you to make decisions about your health care and medical treatment in case you become unable to communicate for yourself. The process of discussing and writing advance directives should happen over time. You can change and update advance directives at any time. Advance directives may include a medical power of attorney, a living will, and a DNR or DNAR order. This information is not intended to replace advice given to you by your health care provider. Make sure you discuss any questions you have with your health care provider. Document Revised: 09/25/2019 Document Reviewed: 09/25/2019 Elsevier Patient Education  2022 [place] Prevention in the Home, Adult Falls can cause injuries and affect people of all ages. There are many simple things that you can do to make your home safe and to help prevent falls. Ask for help when making these changes, if needed. What actions can I take to prevent falls? General instructions Use good lighting in all rooms. Replace any light bulbs that burn out, turn on lights if it is dark, and use night-lights. Place frequently used items in easy-to-reach places. Lower the shelves around your home if necessary. Set up furniture so that there are clear paths around it. Avoid moving your furniture around. Remove throw rugs and other tripping hazards from the floor. Avoid walking on wet floors. Fix any uneven floor surfaces. Add color or contrast paint or tape to grab bars and handrails in your home. Place contrasting color strips on the first and last steps of staircases. When you use a stepladder, make sure that it is completely opened and that the sides and supports are firmly locked. Have someone hold the ladder while you are using it. Do not climb a  closed stepladder. Know where your pets are when moving through your home. What can I do in the bathroom?   Keep the floor dry. Immediately clean up any water that is on the floor. Remove soap buildup in the tub or shower regularly. Use nonskid mats or decals on the floor of the tub or shower. Attach bath mats securely with double-sided, nonslip rug tape. If you need to sit down while you are in the shower, use a plastic, nonslip stool. Install grab bars by the toilet and in the tub and shower. Do not use towel bars as grab bars. What can I do in the bedroom? Make sure that a bedside light is easy to reach. Do not use oversized bedding that reaches the floor. Have a firm chair that has side arms to use for getting dressed. What can I do in the kitchen? Clean up any spills right away. If you need to reach for something above you, use a sturdy step stool that has a grab bar. Keep electrical cables out of the way. Do not use floor polish or  wax that makes floors slippery. If you must use wax, make sure that it is non-skid floor wax. What can I do with my stairs? Do not leave any items on the stairs. Make sure that you have a light switch at the top and the bottom of the stairs. Have them installed if you do not have them. Make sure that there are handrails on both sides of the stairs. Fix handrails that are broken or loose. Make sure that handrails are as long as the staircases. Install non-slip stair treads on all stairs in your home. Avoid having throw rugs at the top or bottom of stairs, or secure the rugs with carpet tape to prevent them from moving. Choose a carpet design that does not hide the edge of steps on the stairs. Check any carpeting to make sure that it is firmly attached to the stairs. Fix any carpet that is loose or worn. What can I do on the outside of my home? Use bright outdoor lighting. Regularly repair the edges of walkways and driveways and fix any cracks. Remove  high doorway thresholds. Trim any shrubbery on the main path into your home. Regularly check that handrails are securely fastened and in good repair. Both sides of all steps should have handrails. Install guardrails along the edges of any raised decks or porches. Clear walkways of debris and clutter, including tools and rocks. Have leaves, snow, and ice cleared regularly. Use sand or salt on walkways during winter months. In the garage, clean up any spills right away, including grease or oil spills. What other actions can I take? Wear closed-toe shoes that fit well and support your feet. Wear shoes that have rubber soles or low heels. Use mobility aids as needed, such as canes, walkers, scooters, and crutches. Review your medicines with your health care provider. Some medicines can cause dizziness or changes in blood pressure, which increase your risk of falling. Talk with your health care provider about other ways that you can decrease your risk of falls. This may include working with a physical therapist or trainer to improve your strength, balance, and endurance. Where to find more information Centers for Disease Control and Prevention, [employer]: http://www.wolf.info/ National Institute on Aging: http://kim-miller.com/ Contact a health care provider if: You are afraid of falling at home. You feel weak, drowsy, or dizzy at home. You fall at home. Summary There are many simple things that you can do to make your home safe and to help prevent falls. Ways to make your home safe include removing tripping hazards and installing grab bars in the bathroom. Ask for help when making these changes in your home. This information is not intended to replace advice given to you by your health care provider. Make sure you discuss any questions you have with your health care provider. Document Revised: 07/25/2019 Document Reviewed: 07/25/2019 Elsevier Patient Education  [place].

## 2021-03-09 NOTE — Addendum Note (Signed)
Addended by: Janie Morning on: 03/09/2021 08:42 PM ? ? Modules accepted: Level of Service ? ?

## 2021-04-20 DIAGNOSIS — H5213 Myopia, bilateral: Secondary | ICD-10-CM | POA: Diagnosis not present

## 2021-04-20 DIAGNOSIS — H52223 Regular astigmatism, bilateral: Secondary | ICD-10-CM | POA: Diagnosis not present

## 2021-04-20 DIAGNOSIS — H2513 Age-related nuclear cataract, bilateral: Secondary | ICD-10-CM | POA: Diagnosis not present

## 2021-05-28 ENCOUNTER — Ambulatory Visit: Payer: Medicare Other | Admitting: Family Medicine

## 2021-06-08 ENCOUNTER — Ambulatory Visit (INDEPENDENT_AMBULATORY_CARE_PROVIDER_SITE_OTHER): Payer: Medicare Other | Admitting: Family Medicine

## 2021-06-08 DIAGNOSIS — E782 Mixed hyperlipidemia: Secondary | ICD-10-CM

## 2021-06-08 DIAGNOSIS — R7301 Impaired fasting glucose: Secondary | ICD-10-CM

## 2021-06-08 DIAGNOSIS — I10 Essential (primary) hypertension: Secondary | ICD-10-CM

## 2021-06-08 NOTE — Progress Notes (Signed)
Canceled.

## 2021-06-25 ENCOUNTER — Other Ambulatory Visit: Payer: Self-pay

## 2021-06-25 ENCOUNTER — Encounter: Payer: Self-pay | Admitting: Family Medicine

## 2021-06-25 ENCOUNTER — Ambulatory Visit (INDEPENDENT_AMBULATORY_CARE_PROVIDER_SITE_OTHER): Payer: Medicare Other | Admitting: Family Medicine

## 2021-06-25 VITALS — BP 152/82 | HR 58 | Temp 97.8°F | Ht 67.0 in | Wt 181.0 lb

## 2021-06-25 DIAGNOSIS — I10 Essential (primary) hypertension: Secondary | ICD-10-CM

## 2021-06-25 DIAGNOSIS — R7301 Impaired fasting glucose: Secondary | ICD-10-CM | POA: Diagnosis not present

## 2021-06-25 DIAGNOSIS — E782 Mixed hyperlipidemia: Secondary | ICD-10-CM | POA: Diagnosis not present

## 2021-06-25 DIAGNOSIS — I69393 Ataxia following cerebral infarction: Secondary | ICD-10-CM

## 2021-06-25 DIAGNOSIS — I6529 Occlusion and stenosis of unspecified carotid artery: Secondary | ICD-10-CM

## 2021-06-25 MED ORDER — BLOOD PRESSURE KIT: 1.0000 | PACK | Freq: Every day | 0 refills | Status: AC

## 2021-06-25 NOTE — Progress Notes (Addendum)
Subjective:  Patient ID: Jeremiah Russell, male    DOB: February 17, 1950  Age: 70 y.o. MRN: [id number]  Chief Complaint  Patient presents with   Hyperlipidemia   Hypertension   Prediabetes   HPI: PreDiabetes:  Most recent A1C: 5.8 Current medications: Not currently taking the metformin, patient stated that he forgot to ask if he was supposed to still be taking it.  Hyperlipidemia: Current medications: Rosuvastatin 10 mg once daily.  Hypertension: No medicines. Patient does not check Bp readings at home.   Diet: fairly healthy Exercise: no   Current Outpatient Medications on File Prior to Visit  Medication Sig Dispense Refill   rosuvastatin (CRESTOR) 10 MG tablet Take 1 tablet (10 mg total) by mouth daily. 90 tablet 3   No current facility-administered medications on file prior to visit.   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   Ataxic gait 06/04/2019   Essential hypertension    Impaired fasting glucose    Mini stroke    Mixed hyperlipidemia    Other rosacea    Stroke [facility])    cerebellar   Vertigo    Past Surgical History:  Procedure Laterality Date   ANAL FISSURE REPAIR     TONSILLECTOMY      Family History  Problem Relation Age of Onset   Alzheimer's disease Mother    Cancer Paternal Grandfather    Social History   Socioeconomic History   Marital status: Single    Spouse name: Not on file   Number of children: Not on file   Years of education: Not on file   Highest education level: Some college, no degree  Occupational History   Occupation: Retired  Tobacco Use   Smoking status: Former    Packs/day: 3.00    Years: 66.00    Total pack years: 198.00    Types: Cigarettes    Quit date: 01/05/1999    Years since quitting: 22.4   Smokeless tobacco: Never  Substance and Sexual Activity   Alcohol use: Not Currently   Drug use: Never   Sexual activity: Not on file  Other  Topics Concern   Not on file  Social History Narrative   Patient is right-handed. He lives alone in a 2 story house with a basement.   Social Determinants of Health   Financial Resource Strain: Low Risk  (03/09/2021)   Overall Financial Resource Strain (CARDIA)    Difficulty of Paying Living Expenses: Not hard at all  Food Insecurity: No Food Insecurity (03/09/2021)   Hunger Vital Sign    Worried About Running Out of Food in the Last Year: Never true    Ran Out of Food in the Last Year: Never true  Transportation Needs: No Transportation Needs (03/09/2021)   PRAPARE - Hydrologist (Medical): No    Lack of Transportation (Non-Medical): No  Physical Activity: Inactive (03/09/2021)   Exercise Vital Sign    Days of Exercise per Week: 0 days    Minutes of Exercise per Session: 0 min  Stress: No Stress Concern Present (03/09/2021)   [place]    Feeling of Stress : Not at all  Social Connections: Socially Isolated (03/09/2021)   Social Connection and Isolation Panel [NHANES]    Frequency of Communication with Friends and Family: Never    Frequency of Social Gatherings with  Friends and Family: Never    Attends Religious Services: Never    Marine scientist or Organizations: No    Attends Music therapist: Never    Marital Status: Never married    Review of Systems  Constitutional:  Negative for appetite change, fatigue and fever.  HENT:  Negative for congestion, ear pain, sinus pressure and sore throat.   Respiratory:  Negative for cough, chest tightness, shortness of breath and wheezing.   Cardiovascular:  Negative for chest pain and palpitations.  Gastrointestinal:  Negative for abdominal pain, constipation, diarrhea, nausea and vomiting.  Genitourinary:  Negative for dysuria and hematuria.  Musculoskeletal:  Positive for neck pain. Negative for arthralgias, back pain, joint  swelling and myalgias.  Skin:  Negative for rash.  Neurological:  Negative for dizziness, weakness and headaches.  Psychiatric/Behavioral:  Negative for dysphoric mood. The patient is not nervous/anxious.      Objective:  BP (!) 152/82   Pulse (!) 58   Temp 97.8 F (36.6 C) (Temporal)   Ht 5' 7"  (1.702 m)   Wt 181 lb (82.1 kg)   SpO2 96%   BMI 28.35 kg/m      06/25/2021    2:15 PM 06/25/2021    1:44 PM 03/09/2021    1:44 PM  BP/Weight  Systolic BP 403 474 259  Diastolic BP 82 78 80  Wt. (Lbs)  181 182  BMI  28.35 kg/m2 27.67 kg/m2    Physical Exam Vitals reviewed.  Constitutional:      Appearance: Normal appearance. He is normal weight.  Cardiovascular:     Rate and Rhythm: Normal rate and regular rhythm.     Heart sounds: Normal heart sounds.  Pulmonary:     Effort: Pulmonary effort is normal.     Breath sounds: Normal breath sounds.  Abdominal:     General: Abdomen is flat. Bowel sounds are normal.     Palpations: Abdomen is soft.  Neurological:     Mental Status: He is alert and oriented to person, place, and time.  Psychiatric:        Mood and Affect: Mood normal.        Behavior: Behavior normal.     Diabetic Foot Exam - Simple   No data filed      Lab Results  Component Value Date   WBC 8.7 06/25/2021   HGB 16.3 06/25/2021   HCT 46.0 06/25/2021   PLT 279 06/25/2021   GLUCOSE 88 06/25/2021   CHOL 161 06/25/2021   TRIG 93 06/25/2021   HDL 51 06/25/2021   LDLCALC 93 06/25/2021   ALT 16 06/25/2021   AST 18 06/25/2021   NA 141 06/25/2021   K 4.3 06/25/2021   CL 104 06/25/2021   CREATININE 0.90 06/25/2021   BUN 6 (L) 06/25/2021   CO2 23 06/25/2021   TSH 1.110 07/23/2019   HGBA1C 5.8 (H) 06/25/2021   MICROALBUR 30 07/23/2019      Assessment & Plan:   Problem List Items Addressed This Visit       Cardiovascular and Mediastinum   Essential hypertension - Primary    Get blood pressure monitor at pharmacy.  Recommend check blood pressure  once daily in the morning.  Call with log in approximately 1 week.      Relevant Medications   Blood Pressure KIT   Other Relevant Orders   CBC With Diff/Platelet (Completed)   Comprehensive metabolic panel (Completed)   Stenosis of carotid artery  Recommend start on a baby aspirin 81 mg daily. Anticipate increasing cholesterol medicine after labs come back. Recommend eat healthy and exercise.        Endocrine   Impaired fasting glucose    Continue to work on healthy diet and exercise.  Recommend chair exercises and exercises with weights to his upper extremities.      Relevant Orders   Hemoglobin A1c (Completed)     Other   Mixed hyperlipidemia    Await labs.  Started on Crestor 3 months ago.  Continue to work on [employer] and exercise.        Relevant Orders   Lipid panel (Completed)   Ataxia due to old cerebrovascular accident (CVA)    Recommended use of cane to prevent falls.     .  Meds ordered this encounter  Medications   Blood Pressure KIT    Sig: 1 each by Does not apply route daily.    Dispense:  1 kit    Refill:  0    I10 hypertension    Orders Placed This Encounter  Procedures   CBC With Diff/Platelet   Comprehensive metabolic panel   Lipid panel   Hemoglobin A1c   Cardiovascular Risk Assessment    07/20/2021: Patient saw Ophthalmology the day after this appt and is scheduled for right cataract surgery. His bp was elevated when he came to this appointment. We will follow up with him on his bp log. Regardless, he is cleared for cataract surgery,   Follow-up: Return in about 3 months (around 09/25/2021) for chronic fasting.  An After Visit Summary was printed and given to the patient.  I,Lauren M Auman,acting as a scribe for Rochel Brome, MD.,have documented all relevant documentation on the behalf of Rochel Brome, MD,as directed by  Rochel Brome, MD while in the presence of Rochel Brome, MD.   Rochel Brome, MD [place] [phone]

## 2021-06-25 NOTE — Assessment & Plan Note (Signed)
Check bp daily.

## 2021-06-25 NOTE — Assessment & Plan Note (Signed)
Await labs.  Started on Crestor 3 months ago.Continue to work on [employer] and exercise.

## 2021-06-25 NOTE — Assessment & Plan Note (Signed)
Recommend start on a baby aspirin 81 mg daily. Anticipate increasing cholesterol medicine after labs come back. Recommend eat healthy and exercise.

## 2021-06-25 NOTE — Patient Instructions (Signed)
Get blood pressure monitor at pharmacy.  Recommend check blood pressure once daily in the morning.  Call with log in approximately 1 week.

## 2021-06-25 NOTE — Assessment & Plan Note (Signed)
Continue to work on [employer] and exercise.  Recommend chair exercises and exercises with weights to his upper extremities.

## 2021-06-25 NOTE — Assessment & Plan Note (Signed)
Recommended use of cane to prevent falls.

## 2021-06-26 DIAGNOSIS — H2511 Age-related nuclear cataract, right eye: Secondary | ICD-10-CM | POA: Diagnosis not present

## 2021-06-26 LAB — CBC WITH DIFF/PLATELET
Basophils Absolute: 0.1 10*3/uL (ref 0.0–0.2)
Basos: 1 %
EOS (ABSOLUTE): 0.1 10*3/uL (ref 0.0–0.4)
Eos: 2 %
Hematocrit: 46 % (ref 37.5–51.0)
Hemoglobin: 16.3 g/dL (ref 13.0–17.7)
Immature Grans (Abs): 0 10*3/uL (ref 0.0–0.1)
Immature Granulocytes: 0 %
Lymphocytes Absolute: 2 10*3/uL (ref 0.7–3.1)
Lymphs: 23 %
MCH: 31.6 pg (ref 26.6–33.0)
MCHC: 35.4 g/dL (ref 31.5–35.7)
MCV: 89 fL (ref 79–97)
Monocytes Absolute: 0.7 10*3/uL (ref 0.1–0.9)
Monocytes: 8 %
Neutrophils Absolute: 5.8 10*3/uL (ref 1.4–7.0)
Neutrophils: 66 %
Platelets: 279 10*3/uL (ref 150–450)
RBC: 5.16 x10E6/uL (ref 4.14–5.80)
RDW: 13.1 % (ref 11.6–15.4)
WBC: 8.7 10*3/uL (ref 3.4–10.8)

## 2021-06-26 LAB — HEMOGLOBIN A1C
Est. average glucose Bld gHb Est-mCnc: 120 mg/dL
Hgb A1c MFr Bld: 5.8 % — ABNORMAL HIGH (ref 4.8–5.6)

## 2021-06-26 LAB — COMPREHENSIVE METABOLIC PANEL
ALT: 16 IU/L (ref 0–44)
AST: 18 IU/L (ref 0–40)
Albumin/Globulin Ratio: 1.3 (ref 1.2–2.2)
Albumin: 4.2 g/dL (ref 3.7–4.7)
Alkaline Phosphatase: 107 IU/L (ref 44–121)
BUN/Creatinine Ratio: 7 — ABNORMAL LOW (ref 10–24)
BUN: 6 mg/dL — ABNORMAL LOW (ref 8–27)
Bilirubin Total: 0.8 mg/dL (ref 0.0–1.2)
CO2: 23 mmol/L (ref 20–29)
Calcium: 9.3 mg/dL (ref 8.6–10.2)
Chloride: 104 mmol/L (ref 96–106)
Creatinine, Ser: 0.9 mg/dL (ref 0.76–1.27)
Globulin, Total: 3.2 g/dL (ref 1.5–4.5)
Glucose: 88 mg/dL (ref 70–99)
Potassium: 4.3 mmol/L (ref 3.5–5.2)
Sodium: 141 mmol/L (ref 134–144)
Total Protein: 7.4 g/dL (ref 6.0–8.5)
eGFR: 90 mL/min/{1.73_m2} (ref >59)

## 2021-06-26 LAB — LIPID PANEL
Chol/HDL Ratio: 3.2 ratio (ref 0.0–5.0)
Cholesterol, Total: 161 mg/dL (ref 100–199)
HDL: 51 mg/dL (ref >39)
LDL Chol Calc (NIH): 93 mg/dL (ref 0–99)
Triglycerides: 93 mg/dL (ref 0–149)
VLDL Cholesterol Cal: 17 mg/dL (ref 5–40)

## 2021-06-29 ENCOUNTER — Other Ambulatory Visit: Payer: Self-pay

## 2021-06-29 MED ORDER — ROSUVASTATIN CALCIUM 10 MG PO TABS: 10.0000 mg | ORAL_TABLET | Freq: Every day | ORAL | 3 refills | Status: DC

## 2021-09-03 ENCOUNTER — Encounter: Payer: Self-pay | Admitting: Family Medicine

## 2021-09-03 ENCOUNTER — Ambulatory Visit (INDEPENDENT_AMBULATORY_CARE_PROVIDER_SITE_OTHER): Payer: Medicare Other | Admitting: Family Medicine

## 2021-09-03 VITALS — BP 140/80 | HR 64 | Temp 98.1°F | Ht 67.0 in | Wt 180.0 lb

## 2021-09-03 DIAGNOSIS — I10 Essential (primary) hypertension: Secondary | ICD-10-CM | POA: Diagnosis not present

## 2021-09-03 MED ORDER — LOSARTAN POTASSIUM 25 MG PO TABS: 25.0000 mg | ORAL_TABLET | Freq: Every day | ORAL | 0 refills | Status: DC

## 2021-09-03 NOTE — Progress Notes (Signed)
Subjective:  Patient ID: Jeremiah Russell, male    DOB: February 02, 1950  Age: 71 y.o. MRN: [id number]  Chief Complaint  Patient presents with   Surgical Clearance    HPI  Patient is being seen today for surgical clearance. Patient will be having Cataract surgery scheduled for 09/17/21. His bp was elevated at his last visit 152/92.. Patient is going to purchase a blood pressure cuff or return for nurse visit for blood pressure check however he did not do this.  Patient reports no chest pain, shortness of breath, or headaches.   Current Outpatient Medications on File Prior to Visit  Medication Sig Dispense Refill   aspirin 81 MG chewable tablet Chew 81 mg by mouth daily.     Blood Pressure KIT 1 each by Does not apply route daily. 1 kit 0   rosuvastatin (CRESTOR) 10 MG tablet Take 1 tablet (10 mg total) by mouth daily. 90 tablet 3   No current facility-administered medications on file prior to visit.   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   Ataxic gait 06/04/2019   Essential hypertension    Impaired fasting glucose    Mini stroke    Mixed hyperlipidemia    Other rosacea    Stroke [facility]    cerebellar   Vertigo    Past Surgical History:  Procedure Laterality Date   ANAL FISSURE REPAIR     TONSILLECTOMY      Family History  Problem Relation Age of Onset   Alzheimer's disease Mother    Cancer Paternal Grandfather    Social History   Socioeconomic History   Marital status: Single    Spouse name: Not on file   Number of children: Not on file   Years of education: Not on file   Highest education level: Some college, no degree  Occupational History   Occupation: Retired  Tobacco Use   Smoking status: Former    Packs/day: 3.00    Years: 66.00    Total pack years: 198.00    Types: Cigarettes    Quit date: 01/05/1999    Years since quitting: 22.6   Smokeless tobacco: Never  Substance and Sexual  Activity   Alcohol use: Not Currently   Drug use: Never   Sexual activity: Not on file  Other Topics Concern   Not on file  Social History Narrative   Patient is right-handed. He lives alone in a 2 story house with a basement.   Social Determinants of Health   Financial Resource Strain: Low Risk  (03/09/2021)   Overall Financial Resource Strain (CARDIA)    Difficulty of Paying Living Expenses: Not hard at all  Food Insecurity: No Food Insecurity (03/09/2021)   Hunger Vital Sign    Worried About Running Out of Food in the Last Year: Never true    Ran Out of Food in the Last Year: Never true  Transportation Needs: No Transportation Needs (03/09/2021)   PRAPARE - Hydrologist (Medical): No    Lack of Transportation (Non-Medical): No  Physical Activity: Inactive (03/09/2021)   Exercise Vital Sign    Days of Exercise per Week: 0 days    Minutes of Exercise per Session: 0 min  Stress: No Stress Concern Present (03/09/2021)   [place]    Feeling of Stress : Not at all  Social Connections: Socially Isolated (03/09/2021)   Social Connection and Isolation Panel [NHANES]    Frequency of Communication with Friends and Family: Never    Frequency of Social Gatherings with Friends and Family: Never    Attends Religious Services: Never    Marine scientist or Organizations: No    Attends Music therapist: Never    Marital Status: Never married    Review of Systems  Constitutional:  Negative for appetite change and fatigue.  HENT:  Negative for congestion, ear pain and sore throat.   Respiratory:  Negative for cough and shortness of breath.   Cardiovascular:  Negative for chest pain.  Gastrointestinal:  Negative for abdominal pain, constipation, diarrhea, nausea and vomiting.  Genitourinary:  Negative for dysuria, frequency and urgency.  Musculoskeletal:  Negative for arthralgias, back  pain and myalgias.  Neurological:  Negative for dizziness and headaches.  Psychiatric/Behavioral:  Negative for agitation and sleep disturbance. The patient is not nervous/anxious.      Objective:  BP (!) 140/80 (BP Location: Left Arm, Patient Position: Sitting)   Pulse 64   Temp 98.1 F (36.7 C) (Temporal)   Ht 5' 7"  (1.702 m)   Wt 180 lb (81.6 kg)   SpO2 95%   BMI 28.19 kg/m      09/03/2021    1:47 PM 06/25/2021    2:15 PM 06/25/2021    1:44 PM  BP/Weight  Systolic BP 048 889 169  Diastolic BP 80 82 78  Wt. (Lbs) 180  181  BMI 28.19 kg/m2  28.35 kg/m2    Physical Exam Vitals reviewed.  Constitutional:      Appearance: Normal appearance. He is normal weight.  Neck:     Vascular: No carotid bruit.  Cardiovascular:     Rate and Rhythm: Normal rate and regular rhythm.  Pulmonary:     Effort: Pulmonary effort is normal.     Breath sounds: Normal breath sounds.  Abdominal:     General: Bowel sounds are normal.     Tenderness: There is no abdominal tenderness.  Musculoskeletal:        General: Normal range of motion.     Cervical back: Normal range of motion.  Skin:    General: Skin is warm.  Neurological:     Mental Status: He is alert.  Psychiatric:        Mood and Affect: Mood normal.        Behavior: Behavior normal.     Diabetic Foot Exam - Simple   No data filed      Lab Results  Component Value Date   WBC 8.7 06/25/2021   HGB 16.3 06/25/2021   HCT 46.0 06/25/2021   PLT 279 06/25/2021   GLUCOSE 88 06/25/2021   CHOL 161 06/25/2021   TRIG 93 06/25/2021   HDL 51 06/25/2021   LDLCALC 93 06/25/2021   ALT 16 06/25/2021   AST 18 06/25/2021   NA 141 06/25/2021   K 4.3 06/25/2021   CL 104 06/25/2021   CREATININE 0.90 06/25/2021   BUN 6 (L) 06/25/2021   CO2 23 06/25/2021   TSH 1.110 07/23/2019   HGBA1C 5.8 (H) 06/25/2021   MICROALBUR 30 07/23/2019      Assessment & Plan:   Problem List Items Addressed This Visit       Cardiovascular and  Mediastinum   Essential hypertension - Primary    Start losartan 25 mg daily. Keep appointment at the end of September for chronic  follow-up.  Patient is still cleared for cataract surgery.  Patient's papers were recent to ophthalmology.      Relevant Medications   losartan (COZAAR) 25 MG tablet  .  Meds ordered this encounter  Medications   losartan (COZAAR) 25 MG tablet    Sig: Take 1 tablet (25 mg total) by mouth daily.    Dispense:  90 tablet    Refill:  0   Follow-up: Return in about 4 weeks (around 10/01/2021) for chronic fasting.  An After Visit Summary was printed and given to the patient.    I,Lauren M Auman,acting as a scribe for Rochel Brome, MD.,have documented all relevant documentation on the behalf of Rochel Brome, MD,as directed by  Rochel Brome, MD while in the presence of Rochel Brome, MD.   Rochel Brome, MD [place] [phone]

## 2021-09-03 NOTE — Assessment & Plan Note (Signed)
Start losartan 25 mg daily. Keep appointment at the end of September for chronic follow-up.  Patient is still cleared for cataract surgery.  Patient's papers were recent to ophthalmology.

## 2021-09-17 DIAGNOSIS — H5212 Myopia, left eye: Secondary | ICD-10-CM | POA: Diagnosis not present

## 2021-09-17 DIAGNOSIS — Z87891 Personal history of nicotine dependence: Secondary | ICD-10-CM | POA: Diagnosis not present

## 2021-09-17 DIAGNOSIS — H5201 Hypermetropia, right eye: Secondary | ICD-10-CM | POA: Diagnosis not present

## 2021-09-17 DIAGNOSIS — I10 Essential (primary) hypertension: Secondary | ICD-10-CM | POA: Diagnosis not present

## 2021-09-17 DIAGNOSIS — H2511 Age-related nuclear cataract, right eye: Secondary | ICD-10-CM | POA: Diagnosis not present

## 2021-09-18 DIAGNOSIS — H2512 Age-related nuclear cataract, left eye: Secondary | ICD-10-CM | POA: Diagnosis not present

## 2021-09-29 ENCOUNTER — Ambulatory Visit: Payer: Medicare Other | Admitting: Family Medicine

## 2021-10-01 ENCOUNTER — Ambulatory Visit: Payer: Medicare Other | Admitting: Family Medicine

## 2021-10-01 DIAGNOSIS — H524 Presbyopia: Secondary | ICD-10-CM | POA: Diagnosis not present

## 2021-10-01 DIAGNOSIS — H5203 Hypermetropia, bilateral: Secondary | ICD-10-CM | POA: Diagnosis not present

## 2021-10-01 DIAGNOSIS — Z79899 Other long term (current) drug therapy: Secondary | ICD-10-CM | POA: Diagnosis not present

## 2021-10-01 DIAGNOSIS — I10 Essential (primary) hypertension: Secondary | ICD-10-CM | POA: Diagnosis not present

## 2021-10-01 DIAGNOSIS — Z87891 Personal history of nicotine dependence: Secondary | ICD-10-CM | POA: Diagnosis not present

## 2021-10-01 DIAGNOSIS — H2512 Age-related nuclear cataract, left eye: Secondary | ICD-10-CM | POA: Diagnosis not present

## 2021-10-09 ENCOUNTER — Ambulatory Visit (INDEPENDENT_AMBULATORY_CARE_PROVIDER_SITE_OTHER): Payer: Medicare Other | Admitting: Family Medicine

## 2021-10-09 ENCOUNTER — Encounter: Payer: Self-pay | Admitting: Family Medicine

## 2021-10-09 VITALS — BP 130/70 | HR 65 | Temp 97.4°F | Ht 67.0 in | Wt 181.0 lb

## 2021-10-09 DIAGNOSIS — I10 Essential (primary) hypertension: Secondary | ICD-10-CM | POA: Diagnosis not present

## 2021-10-09 DIAGNOSIS — Z23 Encounter for immunization: Secondary | ICD-10-CM

## 2021-10-09 DIAGNOSIS — R7301 Impaired fasting glucose: Secondary | ICD-10-CM | POA: Diagnosis not present

## 2021-10-09 DIAGNOSIS — I69393 Ataxia following cerebral infarction: Secondary | ICD-10-CM | POA: Diagnosis not present

## 2021-10-09 DIAGNOSIS — E782 Mixed hyperlipidemia: Secondary | ICD-10-CM | POA: Diagnosis not present

## 2021-10-09 MED ORDER — LOSARTAN POTASSIUM 25 MG PO TABS: 25.0000 mg | ORAL_TABLET | Freq: Every day | ORAL | 1 refills | Status: DC

## 2021-10-09 NOTE — Patient Instructions (Signed)
Check if due for shingrix at pharmacy.   Recommend RSV vaccine.

## 2021-10-09 NOTE — Progress Notes (Signed)
Subjective:  Patient ID: Jeremiah Russell, male    DOB: Sep 25, 1950  Age: 71 y.o. MRN: [id number]  Chief Complaint  Patient presents with   Hyperlipidemia   Hypertension    HPI Hypertension: Patient is taking Losartan 25 mg daily, Aspirin 81 mg daily. He does not check his blood pressure at home. Hyperlipidemia: Taking rosuvastatin  10 mg daily. Prediabetes: last a1c was 5.8.  Ataxia gait secondary to stroke: uses a walker.    Current Outpatient Medications on File Prior to Visit  Medication Sig Dispense Refill   aspirin 81 MG chewable tablet Chew 81 mg by mouth daily.     Blood Pressure KIT 1 each by Does not apply route daily. 1 kit 0   ofloxacin (OCUFLOX) 0.3 % ophthalmic solution      rosuvastatin (CRESTOR) 10 MG tablet Take 1 tablet (10 mg total) by mouth daily. 90 tablet 3   prednisoLONE acetate (PRED FORTE) 1 % ophthalmic suspension SMARTSIG:In Eye(s) (Patient not taking: Reported on 10/09/2021)     No current facility-administered medications on file prior to visit.   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   Ataxic gait 06/04/2019   Essential hypertension    Impaired fasting glucose    Mini stroke    Mixed hyperlipidemia    Other rosacea    Stroke [facility])    cerebellar   Vertigo    Past Surgical History:  Procedure Laterality Date   ANAL FISSURE REPAIR     TONSILLECTOMY      Family History  Problem Relation Age of Onset   Alzheimer's disease Mother    Cancer Paternal Grandfather    Social History   Socioeconomic History   Marital status: Single    Spouse name: Not on file   Number of children: Not on file   Years of education: Not on file   Highest education level: Some college, no degree  Occupational History   Occupation: Retired  Tobacco Use   Smoking status: Former    Packs/day: 3.00    Years: 66.00    Total pack years: 198.00    Types: Cigarettes    Quit date:  01/05/1999    Years since quitting: 22.7   Smokeless tobacco: Never  Substance and Sexual Activity   Alcohol use: Not Currently   Drug use: Never   Sexual activity: Not on file  Other Topics Concern   Not on file  Social History Narrative   Patient is right-handed. He lives alone in a 2 story house with a basement.   Social Determinants of Health   Financial Resource Strain: Low Risk  (03/09/2021)   Overall Financial Resource Strain (CARDIA)    Difficulty of Paying Living Expenses: Not hard at all  Food Insecurity: No Food Insecurity (03/09/2021)   Hunger Vital Sign    Worried About Running Out of Food in the Last Year: Never true    Ran Out of Food in the Last Year: Never true  Transportation Needs: No Transportation Needs (03/09/2021)   PRAPARE - Hydrologist (Medical): No    Lack of Transportation (Non-Medical): No  Physical Activity: Inactive (03/09/2021)   Exercise Vital Sign    Days of Exercise per Week: 0 days    Minutes of Exercise per Session: 0 min  Stress: No Stress Concern Present (03/09/2021)   [place] -  Occupational Stress Questionnaire    Feeling of Stress : Not at all  Social Connections: Socially Isolated (03/09/2021)   Social Connection and Isolation Panel [NHANES]    Frequency of Communication with Friends and Family: Never    Frequency of Social Gatherings with Friends and Family: Never    Attends Religious Services: Never    Marine scientist or Organizations: No    Attends Music therapist: Never    Marital Status: Never married    Review of Systems  Constitutional:  Negative for chills, fatigue, fever and unexpected weight change.  HENT:  Negative for congestion, ear pain, sinus pain and sore throat.   Respiratory:  Negative for cough and shortness of breath.   Cardiovascular:  Negative for chest pain and palpitations.  Gastrointestinal:  Negative for abdominal pain, blood in stool,  constipation, diarrhea, nausea and vomiting.  Endocrine: Negative for polydipsia.  Genitourinary:  Negative for dysuria.  Musculoskeletal:  Negative for back pain.  Skin:  Negative for rash.  Neurological:  Negative for headaches.     Objective:  BP 130/70   Pulse 65   Temp (!) 97.4 F (36.3 C)   Ht 5' 7"  (1.702 m)   Wt 181 lb (82.1 kg)   SpO2 94%   BMI 28.35 kg/m      10/09/2021   10:53 AM 09/03/2021    1:47 PM 06/25/2021    2:15 PM  BP/Weight  Systolic BP 579 728 206  Diastolic BP 70 80 82  Wt. (Lbs) 181 180   BMI 28.35 kg/m2 28.19 kg/m2     Physical Exam Vitals reviewed.  Constitutional:      Appearance: Normal appearance.  Neck:     Vascular: No carotid bruit.  Cardiovascular:     Rate and Rhythm: Normal rate and regular rhythm.     Heart sounds: Normal heart sounds.  Pulmonary:     Effort: Pulmonary effort is normal.     Breath sounds: Normal breath sounds. No wheezing, rhonchi or rales.  Abdominal:     General: Bowel sounds are normal.     Palpations: Abdomen is soft.     Tenderness: There is no abdominal tenderness.  Neurological:     Mental Status: He is alert and oriented to person, place, and time.  Psychiatric:        Mood and Affect: Mood normal.        Behavior: Behavior normal.     Diabetic Foot Exam - Simple   No data filed      Lab Results  Component Value Date   WBC 7.5 10/09/2021   HGB 17.0 10/09/2021   HCT 49.2 10/09/2021   PLT 277 10/09/2021   GLUCOSE 101 (H) 10/09/2021   CHOL 151 10/09/2021   TRIG 72 10/09/2021   HDL 55 10/09/2021   LDLCALC 82 10/09/2021   ALT 21 10/09/2021   AST 22 10/09/2021   NA 141 10/09/2021   K 4.6 10/09/2021   CL 102 10/09/2021   CREATININE 0.92 10/09/2021   BUN 7 (L) 10/09/2021   CO2 24 10/09/2021   TSH 1.110 07/23/2019   HGBA1C 6.3 (H) 10/09/2021   MICROALBUR 30 07/23/2019      Assessment & Plan:   Problem List Items Addressed This Visit       Cardiovascular and Mediastinum    Essential hypertension - Primary    Well controlled.  No changes to medicines.  taking Losartan 25 mg daily, Aspirin 81 mg daily Continue  to work on eating a healthy diet and exercise.  Labs drawn today.       Relevant Medications   losartan (COZAAR) 25 MG tablet   Other Relevant Orders   Lipid panel (Completed)   Comprehensive metabolic panel (Completed)   Cardiovascular Risk Assessment (Completed)     Endocrine   Impaired fasting glucose    Hemoglobin A1c 6.2% , 3 month avg of blood sugars, is in prediabetic range.  In order to prevent progression to diabetes, recommend low carb diet and regular exercise      Relevant Orders   Hemoglobin A1c (Completed)     Other   Mixed hyperlipidemia    Well controlled.  No changes to medicines. Taking rosuvastatin  10 mg daily Continue to work on eating a healthy diet and exercise.  Labs drawn today.       Relevant Medications   losartan (COZAAR) 25 MG tablet   Other Relevant Orders   CBC with Differential/Platelet (Completed)   Ataxia due to old cerebrovascular accident (CVA)    Using cane      Other Visit Diagnoses     Need for immunization against influenza       Relevant Orders   Flu Vaccine QUAD High Dose(Fluad) (Completed)     .  Meds ordered this encounter  Medications   losartan (COZAAR) 25 MG tablet    Sig: Take 1 tablet (25 mg total) by mouth daily.    Dispense:  90 tablet    Refill:  1    Orders Placed This Encounter  Procedures   Flu Vaccine QUAD High Dose(Fluad)   Lipid panel   CBC with Differential/Platelet   Comprehensive metabolic panel   Hemoglobin A1c   Cardiovascular Risk Assessment     Follow-up: Return in about 6 months (around 04/10/2022) for chronic fasting, awv (KIM) due after3/07/2022.  An After Visit Summary was printed and given to the patient.  Rochel Brome, MD [facility] [phone]

## 2021-10-10 LAB — CBC WITH DIFFERENTIAL/PLATELET
Basophils Absolute: 0.1 10*3/uL (ref 0.0–0.2)
Basos: 1 %
EOS (ABSOLUTE): 0.2 10*3/uL (ref 0.0–0.4)
Eos: 2 %
Hematocrit: 49.2 % (ref 37.5–51.0)
Hemoglobin: 17 g/dL (ref 13.0–17.7)
Immature Grans (Abs): 0 10*3/uL (ref 0.0–0.1)
Immature Granulocytes: 0 %
Lymphocytes Absolute: 1.9 10*3/uL (ref 0.7–3.1)
Lymphs: 25 %
MCH: 31.4 pg (ref 26.6–33.0)
MCHC: 34.6 g/dL (ref 31.5–35.7)
MCV: 91 fL (ref 79–97)
Monocytes Absolute: 0.7 10*3/uL (ref 0.1–0.9)
Monocytes: 10 %
Neutrophils Absolute: 4.6 10*3/uL (ref 1.4–7.0)
Neutrophils: 62 %
Platelets: 277 10*3/uL (ref 150–450)
RBC: 5.41 x10E6/uL (ref 4.14–5.80)
RDW: 12.5 % (ref 11.6–15.4)
WBC: 7.5 10*3/uL (ref 3.4–10.8)

## 2021-10-10 LAB — COMPREHENSIVE METABOLIC PANEL
ALT: 21 IU/L (ref 0–44)
AST: 22 IU/L (ref 0–40)
Albumin/Globulin Ratio: 1.6 (ref 1.2–2.2)
Albumin: 4.5 g/dL (ref 3.8–4.8)
Alkaline Phosphatase: 102 IU/L (ref 44–121)
BUN/Creatinine Ratio: 8 — ABNORMAL LOW (ref 10–24)
BUN: 7 mg/dL — ABNORMAL LOW (ref 8–27)
Bilirubin Total: 0.6 mg/dL (ref 0.0–1.2)
CO2: 24 mmol/L (ref 20–29)
Calcium: 9.2 mg/dL (ref 8.6–10.2)
Chloride: 102 mmol/L (ref 96–106)
Creatinine, Ser: 0.92 mg/dL (ref 0.76–1.27)
Globulin, Total: 2.8 g/dL (ref 1.5–4.5)
Glucose: 101 mg/dL — ABNORMAL HIGH (ref 70–99)
Potassium: 4.6 mmol/L (ref 3.5–5.2)
Sodium: 141 mmol/L (ref 134–144)
Total Protein: 7.3 g/dL (ref 6.0–8.5)
eGFR: 88 mL/min/{1.73_m2} (ref >59)

## 2021-10-10 LAB — LIPID PANEL
Chol/HDL Ratio: 2.7 ratio (ref 0.0–5.0)
Cholesterol, Total: 151 mg/dL (ref 100–199)
HDL: 55 mg/dL (ref >39)
LDL Chol Calc (NIH): 82 mg/dL (ref 0–99)
Triglycerides: 72 mg/dL (ref 0–149)
VLDL Cholesterol Cal: 14 mg/dL (ref 5–40)

## 2021-10-10 LAB — HEMOGLOBIN A1C
Est. average glucose Bld gHb Est-mCnc: 134 mg/dL
Hgb A1c MFr Bld: 6.3 % — ABNORMAL HIGH (ref 4.8–5.6)

## 2021-10-10 LAB — CARDIOVASCULAR RISK ASSESSMENT

## 2021-10-11 NOTE — Assessment & Plan Note (Signed)
Well controlled.  No changes to medicines.  taking Losartan 25 mg daily, Aspirin 81 mg daily Continue to work on eating a healthy diet and exercise.  Labs drawn today.

## 2021-10-11 NOTE — Assessment & Plan Note (Signed)
Well controlled.  No changes to medicines. Taking rosuvastatin  10 mg daily Continue to work on eating a healthy diet and exercise.  Labs drawn today.

## 2021-10-11 NOTE — Assessment & Plan Note (Signed)
Using cane

## 2021-10-11 NOTE — Assessment & Plan Note (Signed)
Hemoglobin A1c 6.2%, 3 month avg of blood sugars, is in prediabetic range.  In order to prevent progression to diabetes, recommend low carb diet and regular exercise 

## 2021-10-19 ENCOUNTER — Other Ambulatory Visit: Payer: Self-pay

## 2021-10-19 MED ORDER — METFORMIN HCL 500 MG PO TABS: 500.0000 mg | ORAL_TABLET | Freq: Every day | ORAL | 1 refills | Status: DC

## 2021-10-30 DIAGNOSIS — H524 Presbyopia: Secondary | ICD-10-CM | POA: Diagnosis not present

## 2022-04-15 NOTE — Progress Notes (Unsigned)
Subjective:  Patient ID: Margot ChimesRonald A Rossa, male    DOB: 04/20/1950  Age: 72 y.o. MRN: [id number]  Chief Complaint  Patient presents with   Hypertension   Hyperlipidemia    HPI Hypertension: Patient is taking Losartan 25 mg daily, Aspirin 81 mg daily. He does not check his blood pressure at home.  Hyperlipidemia: Taking rosuvastatin 10 mg daily.  Prediabetes: last a1c was 6.3%, taking metformin 500 mg once a day. Tries to eat healthy. Walks in the house due to [place].  Ataxia gait secondary to stroke: uses a walker.      04/16/2022   10:25 AM 03/09/2021    1:48 PM 05/22/2020    2:27 PM 02/07/2020    2:32 PM 07/23/2019    1:35 PM  Depression screen PHQ 2/9  Decreased Interest 0 0 0 0 0  Down, Depressed, Hopeless 0 0 0 0 0  PHQ - 2 Score 0 0 0 0 0         11/01/2019    2:11 PM 02/07/2020    2:32 PM 05/22/2020    2:27 PM 03/09/2021    1:50 PM 04/16/2022   10:25 AM  Fall Risk  Falls in the past year? 0 0 0 1 1  Was there an injury with Fall? 0 0 0 0 0  Fall Risk Category Calculator 0 0 0 1 1  Fall Risk Category (Retired) Low Low Low Low   (RETIRED) Patient Fall Risk Level High fall risk   Moderate fall risk   Patient at Risk for Falls Due to Impaired vision;Impaired balance/gait   Impaired balance/gait Impaired balance/gait  Fall risk Follow up    Falls evaluation completed Falls evaluation completed      Review of Systems  Constitutional:  Negative for chills, diaphoresis, fatigue and fever.  HENT:  Negative for congestion, ear pain and sore throat.   Respiratory:  Negative for cough and shortness of breath.   Cardiovascular:  Negative for chest pain and leg swelling.  Gastrointestinal:  Negative for abdominal pain, constipation, diarrhea, nausea and vomiting.  Genitourinary:  Negative for dysuria and urgency.  Musculoskeletal:  Negative for arthralgias and myalgias.  Neurological:  Negative for dizziness and headaches.  Psychiatric/Behavioral:  Negative for dysphoric mood.      Current Outpatient Medications on File Prior to Visit  Medication Sig Dispense Refill   aspirin 81 MG chewable tablet Chew 81 mg by mouth daily.     Blood Pressure KIT 1 each by Does not apply route daily. 1 kit 0   losartan (COZAAR) 25 MG tablet Take 1 tablet (25 mg total) by mouth daily. 90 tablet 1   ofloxacin (OCUFLOX) 0.3 % ophthalmic solution      prednisoLONE acetate (PRED FORTE) 1 % ophthalmic suspension SMARTSIG:In Eye(s) (Patient not taking: Reported on 10/09/2021)     rosuvastatin (CRESTOR) 10 MG tablet Take 1 tablet (10 mg total) by mouth daily. 90 tablet 3   No current facility-administered medications on file prior to visit.   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   Ataxic gait 06/04/2019   Essential hypertension    Impaired fasting glucose    Mini stroke    Mixed hyperlipidemia    Other rosacea    Stroke    cerebellar   Vertigo    Past Surgical History:  Procedure Laterality Date   ANAL FISSURE REPAIR  TONSILLECTOMY      Family History  Problem Relation Age of Onset   Alzheimer's disease Mother    Cancer Paternal Grandfather    Social History   Socioeconomic History   Marital status: Single    Spouse name: Not on file   Number of children: Not on file   Years of education: Not on file   Highest education level: Some college, no degree  Occupational History   Occupation: Retired  Tobacco Use   Smoking status: Former    Packs/day: 3.00    Years: 66.00    Additional pack years: 0.00    Total pack years: 198.00    Types: Cigarettes    Quit date: 01/05/1999    Years since quitting: 23.2   Smokeless tobacco: Never  Substance and Sexual Activity   Alcohol use: Not Currently   Drug use: Never   Sexual activity: Not on file  Other Topics Concern   Not on file  Social History Narrative   Patient is right-handed. He lives alone in a 2 story house with a basement.    Social Determinants of Health   Financial Resource Strain: Low Risk  (03/09/2021)   Overall Financial Resource Strain (CARDIA)    Difficulty of Paying Living Expenses: Not hard at all  Food Insecurity: No Food Insecurity (03/09/2021)   Hunger Vital Sign    Worried About Running Out of Food in the Last Year: Never true    Ran Out of Food in the Last Year: Never true  Transportation Needs: No Transportation Needs (03/09/2021)   PRAPARE - Administrator, Civil Service (Medical): No    Lack of Transportation (Non-Medical): No  Physical Activity: Inactive (03/09/2021)   Exercise Vital Sign    Days of Exercise per Week: 0 days    Minutes of Exercise per Session: 0 min  Stress: No Stress Concern Present (03/09/2021)   [employer] of Occupational Health - Occupational Stress Questionnaire    Feeling of Stress : Not at all  Social Connections: Socially Isolated (03/09/2021)   Social Connection and Isolation Panel [NHANES]    Frequency of Communication with Friends and Family: Never    Frequency of Social Gatherings with Friends and Family: Never    Attends Religious Services: Never    Diplomatic Services operational officer: No    Attends Engineer, structural: Never    Marital Status: Never married    Objective:  BP 130/82   Pulse 68   Temp (!) 97.3 F (36.3 C)   Ht 5\' 8"  (1.727 m)   Wt 181 lb (82.1 kg)   SpO2 95%   BMI 27.52 kg/m      04/16/2022   10:24 AM 10/09/2021   10:53 AM 09/03/2021    1:47 PM  BP/Weight  Systolic BP 130 130 140  Diastolic BP 82 70 80  Wt. (Lbs) 181 181 180  BMI 27.52 kg/m2 28.35 kg/m2 28.19 kg/m2    Physical Exam Vitals reviewed.  Constitutional:      Appearance: Normal appearance. He is normal weight.  Cardiovascular:     Rate and Rhythm: Normal rate and regular rhythm.     Heart sounds: No murmur heard. Pulmonary:     Effort: Pulmonary effort is normal.     Breath sounds: Normal breath sounds.  Abdominal:     General:  Abdomen is flat. Bowel sounds are normal.     Palpations: Abdomen is soft.     Tenderness: There  is no abdominal tenderness.  Neurological:     Mental Status: He is alert and oriented to person, place, and time.     Gait: Gait abnormal (uses walker. wide based.).  Psychiatric:        Mood and Affect: Mood normal.        Behavior: Behavior normal.     Diabetic Foot Exam - Simple   No data filed      Lab Results  Component Value Date   WBC 8.2 04/16/2022   HGB 16.4 04/16/2022   HCT 47.9 04/16/2022   PLT 278 04/16/2022   GLUCOSE 111 (H) 04/16/2022   CHOL 140 04/16/2022   TRIG 99 04/16/2022   HDL 42 04/16/2022   LDLCALC 79 04/16/2022   ALT 19 04/16/2022   AST 20 04/16/2022   NA 140 04/16/2022   K 5.5 (H) 04/16/2022   CL 102 04/16/2022   CREATININE 1.13 04/16/2022   BUN 9 04/16/2022   CO2 23 04/16/2022   TSH 1.110 07/23/2019   HGBA1C 6.1 (H) 04/16/2022   MICROALBUR 30 07/23/2019      Assessment & Plan:    Essential hypertension Assessment & Plan: Well controlled.  No medication changes recommended. Taking Losartan 25 mg daily, Aspirin 81 mg daily Continue healthy diet and exercise.    Orders: -     Comprehensive metabolic panel -     CBC with Differential/Platelet -     Cardiovascular Risk Assessment  Impaired fasting glucose Assessment & Plan: Recommend continue to work on eating healthy diet and exercise.  Labs drawn today  Orders: -     Hemoglobin A1c -     metFORMIN HCl; Take 1 tablet (500 mg total) by mouth daily with breakfast.  Dispense: 90 tablet; Refill: 1  Mixed hyperlipidemia Assessment & Plan: Well controlled.  No changes to medicines. Taking rosuvastatin 10 mg daily. Continue to work on eating a healthy diet and exercise.  Labs drawn today.    Orders: -     Lipid panel  Encounter for immunization [employer] Fall 2023 Covid-19 Vaccine 1yrs and older  Ataxia due to old cerebrovascular accident (CVA) Assessment & Plan: Stable.  Using a walker.      Meds ordered this encounter  Medications   metFORMIN (GLUCOPHAGE) 500 MG tablet    Sig: Take 1 tablet (500 mg total) by mouth daily with breakfast.    Dispense:  90 tablet    Refill:  1    Orders Placed This Encounter  Procedures   [employer] Fall 2023 Covid-19 Vaccine 45yrs and older   Comprehensive metabolic panel   Hemoglobin A1c   Lipid panel   CBC with Differential/Platelet   Cardiovascular Risk Assessment     Follow-up: Return in about 6 months (around 10/16/2022) for chronic, fasting.   I,Marla I Leal-Borjas,acting as a scribe for Blane Ohara, MD.,have documented all relevant documentation on the behalf of Blane Ohara, MD,as directed by  Blane Ohara, MD while in the presence of Blane Ohara, MD.   An After Visit Summary was printed and given to the patient.  I attest that I have reviewed this visit and agree with the plan scribed by my staff.   Blane Ohara, MD [facility] [phone]

## 2022-04-16 ENCOUNTER — Encounter: Payer: Self-pay | Admitting: Family Medicine

## 2022-04-16 ENCOUNTER — Ambulatory Visit (INDEPENDENT_AMBULATORY_CARE_PROVIDER_SITE_OTHER): Payer: Medicare Other | Admitting: Family Medicine

## 2022-04-16 VITALS — BP 130/82 | HR 68 | Temp 97.3°F | Ht 68.0 in | Wt 181.0 lb

## 2022-04-16 DIAGNOSIS — I69393 Ataxia following cerebral infarction: Secondary | ICD-10-CM | POA: Diagnosis not present

## 2022-04-16 DIAGNOSIS — E782 Mixed hyperlipidemia: Secondary | ICD-10-CM | POA: Diagnosis not present

## 2022-04-16 DIAGNOSIS — I10 Essential (primary) hypertension: Secondary | ICD-10-CM | POA: Diagnosis not present

## 2022-04-16 DIAGNOSIS — Z23 Encounter for immunization: Secondary | ICD-10-CM | POA: Diagnosis not present

## 2022-04-16 DIAGNOSIS — R7301 Impaired fasting glucose: Secondary | ICD-10-CM | POA: Diagnosis not present

## 2022-04-16 MED ORDER — METFORMIN HCL 500 MG PO TABS: 500.0000 mg | ORAL_TABLET | Freq: Every day | ORAL | 1 refills | Status: DC

## 2022-04-16 NOTE — Assessment & Plan Note (Signed)
Well controlled.  No medication changes recommended. Continue healthy diet and exercise.  

## 2022-04-16 NOTE — Assessment & Plan Note (Signed)
Well controlled.  No changes to medicines. Taking rosuvastatin  10 mg daily Continue to work on eating a healthy diet and exercise.  Labs drawn today.  

## 2022-04-16 NOTE — Assessment & Plan Note (Signed)
Recommend continue to work on eating healthy diet and exercise. ?Labs drawn today.  ?

## 2022-04-16 NOTE — Patient Instructions (Addendum)
Discuss shingrix and tdap with pharmacist

## 2022-04-18 NOTE — Assessment & Plan Note (Addendum)
Stable. Using a walker.

## 2022-04-18 NOTE — Progress Notes (Signed)
Blood count normal.  Liver function normal.  Kidney function normal.  Potassium little up. Recommend recheck potassium this coming week.  Cholesterol: good HBA1C: 6.1. improved.

## 2022-04-19 ENCOUNTER — Other Ambulatory Visit: Payer: Self-pay

## 2022-04-19 LAB — LIPID PANEL
Chol/HDL Ratio: 3.3 ratio (ref 0.0–5.0)
Cholesterol, Total: 140 mg/dL (ref 100–199)
HDL: 42 mg/dL (ref >39)
LDL Chol Calc (NIH): 79 mg/dL (ref 0–99)
Triglycerides: 99 mg/dL (ref 0–149)
VLDL Cholesterol Cal: 19 mg/dL (ref 5–40)

## 2022-04-19 LAB — COMPREHENSIVE METABOLIC PANEL
ALT: 19 IU/L (ref 0–44)
AST: 20 IU/L (ref 0–40)
Albumin/Globulin Ratio: 1.4 (ref 1.2–2.2)
Albumin: 4.2 g/dL (ref 3.8–4.8)
Alkaline Phosphatase: 101 IU/L (ref 44–121)
BUN/Creatinine Ratio: 8 — ABNORMAL LOW (ref 10–24)
BUN: 9 mg/dL (ref 8–27)
Bilirubin Total: 0.8 mg/dL (ref 0.0–1.2)
CO2: 23 mmol/L (ref 20–29)
Calcium: 9.7 mg/dL (ref 8.6–10.2)
Chloride: 102 mmol/L (ref 96–106)
Creatinine, Ser: 1.13 mg/dL (ref 0.76–1.27)
Globulin, Total: 3 g/dL (ref 1.5–4.5)
Glucose: 111 mg/dL — ABNORMAL HIGH (ref 70–99)
Potassium: 5.5 mmol/L — ABNORMAL HIGH (ref 3.5–5.2)
Sodium: 140 mmol/L (ref 134–144)
Total Protein: 7.2 g/dL (ref 6.0–8.5)
eGFR: 68 mL/min/{1.73_m2} (ref >59)

## 2022-04-19 LAB — CBC WITH DIFFERENTIAL/PLATELET
Basophils Absolute: 0.1 10*3/uL (ref 0.0–0.2)
Basos: 1 %
EOS (ABSOLUTE): 0.2 10*3/uL (ref 0.0–0.4)
Eos: 2 %
Hematocrit: 47.9 % (ref 37.5–51.0)
Hemoglobin: 16.4 g/dL (ref 13.0–17.7)
Immature Grans (Abs): 0 10*3/uL (ref 0.0–0.1)
Immature Granulocytes: 0 %
Lymphocytes Absolute: 2 10*3/uL (ref 0.7–3.1)
Lymphs: 24 %
MCH: 31.2 pg (ref 26.6–33.0)
MCHC: 34.2 g/dL (ref 31.5–35.7)
MCV: 91 fL (ref 79–97)
Monocytes Absolute: 0.8 10*3/uL (ref 0.1–0.9)
Monocytes: 10 %
Neutrophils Absolute: 5.2 10*3/uL (ref 1.4–7.0)
Neutrophils: 63 %
Platelets: 278 10*3/uL (ref 150–450)
RBC: 5.25 x10E6/uL (ref 4.14–5.80)
RDW: 12.4 % (ref 11.6–15.4)
WBC: 8.2 10*3/uL (ref 3.4–10.8)

## 2022-04-19 LAB — CARDIOVASCULAR RISK ASSESSMENT

## 2022-04-19 LAB — HEMOGLOBIN A1C
Est. average glucose Bld gHb Est-mCnc: 128 mg/dL
Hgb A1c MFr Bld: 6.1 % — ABNORMAL HIGH (ref 4.8–5.6)

## 2022-04-22 ENCOUNTER — Other Ambulatory Visit: Payer: Self-pay

## 2022-04-22 DIAGNOSIS — E875 Hyperkalemia: Secondary | ICD-10-CM

## 2022-04-26 ENCOUNTER — Other Ambulatory Visit: Payer: Medicare Other

## 2022-04-26 DIAGNOSIS — E875 Hyperkalemia: Secondary | ICD-10-CM | POA: Diagnosis not present

## 2022-04-27 LAB — COMPREHENSIVE METABOLIC PANEL
ALT: 22 IU/L (ref 0–44)
AST: 20 IU/L (ref 0–40)
Albumin/Globulin Ratio: 1.3 (ref 1.2–2.2)
Albumin: 4.4 g/dL (ref 3.8–4.8)
Alkaline Phosphatase: 108 IU/L (ref 44–121)
BUN/Creatinine Ratio: 7 — ABNORMAL LOW (ref 10–24)
BUN: 7 mg/dL — ABNORMAL LOW (ref 8–27)
Bilirubin Total: 0.7 mg/dL (ref 0.0–1.2)
CO2: 22 mmol/L (ref 20–29)
Calcium: 9.6 mg/dL (ref 8.6–10.2)
Chloride: 100 mmol/L (ref 96–106)
Creatinine, Ser: 0.98 mg/dL (ref 0.76–1.27)
Globulin, Total: 3.3 g/dL (ref 1.5–4.5)
Glucose: 90 mg/dL (ref 70–99)
Potassium: 4.6 mmol/L (ref 3.5–5.2)
Sodium: 140 mmol/L (ref 134–144)
Total Protein: 7.7 g/dL (ref 6.0–8.5)
eGFR: 81 mL/min/{1.73_m2} (ref >59)

## 2022-04-27 NOTE — Progress Notes (Signed)
Liver function normal.  Kidney function normal.  Potassium returned to normal.

## 2022-04-28 ENCOUNTER — Ambulatory Visit (INDEPENDENT_AMBULATORY_CARE_PROVIDER_SITE_OTHER): Payer: Medicare Other

## 2022-04-28 VITALS — Ht 68.0 in | Wt 181.0 lb

## 2022-04-28 DIAGNOSIS — Z0000 Encounter for general adult medical examination without abnormal findings: Secondary | ICD-10-CM

## 2022-04-28 NOTE — Progress Notes (Signed)
Subjective:   Jeremiah Russell is a 72 y.o. male who presents for Medicare Annual/Subsequent preventive examination.  I connected with  Shi Blankenship Baillie on 04/28/22 by a audio enabled telemedicine application and verified that I am speaking with the correct person using two identifiers.  Patient Location: Home  Provider Location: Home Office  I discussed the limitations of evaluation and management by telemedicine. The patient expressed understanding and agreed to proceed.      Objective:    Today's Vitals   04/28/22 1606  Weight: 181 lb (82.1 kg)  Height:  (1.727 m)   Body mass index is 27.52 kg/m.     04/28/2022    4:14 PM 03/09/2021    1:48 PM  Advanced Directives  Does Patient Have a Medical Advance Directive? Yes No  Type of Estate agent of [place];Living will   Does patient want to make changes to medical advance directive? No - Patient declined   Would patient like information on creating a medical advance directive?  No - Patient declined    Current Medications (verified) Outpatient Encounter Medications as of 04/28/2022  Medication Sig   aspirin 81 MG chewable tablet Chew 81 mg by mouth daily.   Blood Pressure KIT 1 each by Does not apply route daily.   losartan (COZAAR) 25 MG tablet Take 1 tablet (25 mg total) by mouth daily.   metFORMIN (GLUCOPHAGE) 500 MG tablet Take 1 tablet (500 mg total) by mouth daily with breakfast.   rosuvastatin (CRESTOR) 10 MG tablet Take 1 tablet (10 mg total) by mouth daily.   [DISCONTINUED] ofloxacin (OCUFLOX) 0.3 % ophthalmic solution    [DISCONTINUED] prednisoLONE acetate (PRED FORTE) 1 % ophthalmic suspension SMARTSIG:In Eye(s) (Patient not taking: Reported on 10/09/2021)   No facility-administered encounter medications on file as of 04/28/2022.    Allergies (verified) Patient has no known allergies.   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   Ataxic gait 06/04/2019   Essential hypertension    Impaired fasting glucose    Mini stroke    Mixed hyperlipidemia    Other rosacea    Stroke    cerebellar   Vertigo    Past Surgical History:  Procedure Laterality Date   ANAL FISSURE REPAIR     TONSILLECTOMY     Family History  Problem Relation Age of Onset   Alzheimer's disease Mother    Cancer Paternal Grandfather    Social History   Socioeconomic History   Marital status: Single    Spouse name: Not on file   Number of children: Not on file   Years of education: Not on file   Highest education level: Some college, no degree  Occupational History   Occupation: Retired  Tobacco Use   Smoking status: Former    Packs/day: 3.00    Years: 66.00    Additional pack years: 0.00    Total pack years: 198.00    Types: Cigarettes    Quit date: 01/05/1999    Years since quitting: 23.3   Smokeless tobacco: Never  Substance and Sexual Activity   Alcohol use: Not Currently   Drug use: Never   Sexual activity: Not on file  Other Topics Concern   Not on file  Social History Narrative   Patient is right-handed. He lives alone in a 2 story house with a basement.   Social Determinants  of Health   Financial Resource Strain: Low Risk  (04/28/2022)   Overall Financial Resource Strain (CARDIA)    Difficulty of Paying Living Expenses: Not hard at all  Food Insecurity: No Food Insecurity (04/28/2022)   Hunger Vital Sign    Worried About Running Out of Food in the Last Year: Never true    Ran Out of Food in the Last Year: Never true  Transportation Needs: No Transportation Needs (04/28/2022)   PRAPARE - Administrator, Civil Service (Medical): No    Lack of Transportation (Non-Medical): No  Physical Activity: Insufficiently Active (04/28/2022)   Exercise Vital Sign    Days of Exercise per Week: 3 days    Minutes of Exercise per Session: 10 min  Stress: No Stress Concern Present  (04/28/2022)   [employer] of Occupational Health - Occupational Stress Questionnaire    Feeling of Stress : Only a little  Social Connections: Socially Isolated (04/28/2022)   Social Connection and Isolation Panel [NHANES]    Frequency of Communication with Friends and Family: Once a week    Frequency of Social Gatherings with Friends and Family: Never    Attends Religious Services: Never    Database administrator or Organizations: No    Attends Banker Meetings: Never    Marital Status: Never married    Tobacco Counseling Counseling given: Not Answered   Clinical Intake:                 Diabetic?NO (Prediabetic)         Activities of Daily Living    04/28/2022    4:11 PM 04/16/2022   10:25 AM  In your present state of health, do you have any difficulty performing the following activities:  Hearing? 0 0  Vision? 0 0  Difficulty concentrating or making decisions? 0 0  Walking or climbing stairs? 1 0  Comment Patient uses walker   Dressing or bathing? 0 0  Doing errands, shopping? 0 0  Preparing Food and eating ? N   Using the Toilet? N   In the past six months, have you accidently leaked urine? N   Do you have problems with loss of bowel control? N   Managing your Medications? N   Managing your Finances? N   Housekeeping or managing your Housekeeping? N     Patient Care Team: CoxFritzi Mandes, MD as PCP - General (Family Medicine)  Indicate any recent Medical Services you may have received from other than [facility] providers in the past year (date may be approximate).     Assessment:   This is a routine wellness examination for Jeremiah Russell.  Hearing/Vision screen No results found.  Dietary issues and exercise activities discussed: Current Exercise Habits: Home exercise routine, Type of exercise: Other - see comments (Bike), Time (Minutes): 10, Frequency (Times/Week): 3, Weekly Exercise (Minutes/Week): 30, Intensity: Mild   Goals Addressed    None   Depression Screen    04/28/2022    4:09 PM 04/16/2022   10:25 AM 03/09/2021    1:48 PM 05/22/2020    2:27 PM 02/07/2020    2:32 PM 07/23/2019    1:35 PM  PHQ 2/9 Scores  PHQ - 2 Score 0 0 0 0 0 0    Fall Risk    04/16/2022   10:25 AM 03/09/2021    1:50 PM 05/22/2020    2:27 PM 02/07/2020    2:32 PM 11/01/2019    2:11 PM  Fall Risk  Falls in the past year? 1 1 0 0 0  Number falls in past yr: 0 0 0 0 0  Injury with Fall? 0 0 0 0 0  Risk for fall due to : Impaired balance/gait Impaired balance/gait   Impaired vision;Impaired balance/gait  Follow up Falls evaluation completed Falls evaluation completed       FALL RISK PREVENTION PERTAINING TO THE HOME:  Any stairs in or around the home? Yes  If so, are there any without handrails? Yes  Home free of loose throw rugs in walkways, pet beds, electrical cords, etc? Yes  Adequate lighting in your home to reduce risk of falls? Yes   ASSISTIVE DEVICES UTILIZED TO PREVENT FALLS:  Life alert? Yes  Use of a cane, walker or w/c? Yes  Grab bars in the bathroom? Yes  Shower chair or bench in shower? No  Elevated toilet seat or a handicapped toilet? No     Cognitive Function:    02/07/2020    3:18 PM  MMSE - Mini Mental State Exam  Orientation to time 5  Orientation to Place 5  Registration 3  Attention/ Calculation 5  Recall 3  Language- name 2 objects 2  Language- repeat 1  Language- follow 3 step command 3  Language- read & follow direction 1  Write a sentence 1  Copy design 1  Total score 30        04/28/2022    4:17 PM 03/09/2021    2:30 PM  6CIT Screen  What Year? 0 points 0 points  What month? 0 points 0 points  What time? 0 points 0 points  Count back from 20 0 points 0 points  Months in reverse 0 points 0 points  Repeat phrase 2 points 0 points  Total Score 2 points 0 points    Immunizations Immunization History  Administered Date(s) Administered   COVID-19, mRNA, vaccine(Comirnaty)12 years and older  04/16/2022   Fluad Quad(high Dose 65+) 11/01/2019, 02/26/2021, 10/09/2021   Influenza-Unspecified 09/13/2017   PFIZER Comirnaty(Gray Top)Covid-19 Tri-Sucrose Vaccine 05/22/2020   PFIZER(Purple Top)SARS-COV-2 Vaccination 05/09/2019, 05/23/2019   Pfizer Covid-19 Vaccine Bivalent Booster 13yrs & up 02/26/2021   Pneumococcal Conjugate-13 07/21/2015   Pneumococcal Polysaccharide-23 08/12/2016    TDAP status: Due, Education has been provided regarding the importance of this vaccine. Advised may receive this vaccine at local pharmacy or Health Dept. Aware to provide a copy of the vaccination record if obtained from local pharmacy or Health Dept. Verbalized acceptance and understanding.  Flu Vaccine status: Up to date  Pneumococcal vaccine status: Up to date  Covid-19 vaccine status: Completed vaccines  Qualifies for Shingles Vaccine? Yes   Zostavax completed No   Shingrix Completed?: No.    Education has been provided regarding the importance of this vaccine. Patient has been advised to call insurance company to determine out of pocket expense if they have not yet received this vaccine. Advised may also receive vaccine at local pharmacy or Health Dept. Verbalized acceptance and understanding.  Screening Tests Health Maintenance  Topic Date Due   Hepatitis C Screening  Never done   DTaP/Tdap/Td (1 - Tdap) Never done   Zoster Vaccines- Shingrix (1 of 2) Never done   COVID-19 Vaccine (6 - 2023-24 season) 06/11/2022   INFLUENZA VACCINE  08/05/2022   Medicare Annual Wellness (AWV)  04/28/2023   COLONOSCOPY (Pts 45-71yrs Insurance coverage will need to be confirmed)  09/11/2025   Pneumonia Vaccine 37+ Years old  Completed   HPV VACCINES  Aged Out    Health Maintenance  Health Maintenance Due  Topic Date Due   Hepatitis C Screening  Never done   DTaP/Tdap/Td (1 - Tdap) Never done   Zoster Vaccines- Shingrix (1 of 2) Never done    Colorectal cancer screening: No longer required.    Lung Cancer Screening: (Low Dose CT Chest recommended if Age 52-80 years, 30 pack-year currently smoking OR have quit w/in 15years.) does not qualify.    Additional Screening:  Hepatitis C Screening: does qualify; Will do at next visit  Vision Screening: Recommended annual ophthalmology exams for early detection of glaucoma and other disorders of the eye. Is the patient up to date with their annual eye exam?  Yes  Who is the provider or what is the name of the office in which the patient attends annual eye exams? [facility] [place] If pt is not established with a provider, would they like to be referred to a provider to establish care? No .   Dental Screening: Recommended annual dental exams for proper oral hygiene  Community Resource Referral / Chronic Care Management: CRR required this visit?  No   CCM required this visit?  No      Plan:     I have personally reviewed and noted the following in the patient's chart:   Medical and social history Use of alcohol, tobacco or illicit drugs  Current medications and supplements including opioid prescriptions. Patient is not currently taking opioid prescriptions. Functional ability and status Nutritional status Physical activity Advanced directives List of other physicians Hospitalizations, surgeries, and ER visits in previous 12 months Vitals Screenings to include cognitive, depression, and falls Referrals and appointments  In addition, I have reviewed and discussed with patient certain preventive protocols, quality metrics, and best practice recommendations. A written personalized care plan for preventive services as well as general preventive health recommendations were provided to patient.     Jomarie Longs, CMA   04/28/2022

## 2022-10-14 NOTE — Assessment & Plan Note (Signed)
Recommend continue to work on eating healthy diet and exercise. Labs drawn today.  

## 2022-10-14 NOTE — Assessment & Plan Note (Addendum)
Uncontrolled.  Restart Losartan 25 mg daily, Aspirin 81 mg daily Continue healthy diet and exercise.

## 2022-10-14 NOTE — Assessment & Plan Note (Addendum)
Uncontrolled.  Restart rosuvastatin 10 mg daily. Continue to work on eating a healthy diet and exercise.  Labs drawn today.

## 2022-10-14 NOTE — Progress Notes (Unsigned)
Subjective:  Patient ID: Jeremiah Russell, male    DOB: [birth year]  Age: 72 y.o. MRN: [id number]  Chief Complaint  Patient presents with   Medical Management of Chronic Issues    HPI  Jeremiah Russell comes in for fasting lab work.  She reports being out of all of his medications for the last two weeks.  He is here today for medication refills.     Hypertension: Patient is taking Losartan 25 mg daily, Aspirin 81 mg daily. He does not check his blood pressure at home.  Hyperlipidemia: Taking rosuvastatin 10 mg daily.  Prediabetes: last a1c was 6.1%, taking metformin 500 mg once a day. Tries to eat healthy. Walks in the house due to [place].  Ataxia gait secondary to stroke: uses a walker.      10/15/2022   10:29 AM 04/28/2022    4:09 PM 04/16/2022   10:25 AM 03/09/2021    1:48 PM 05/22/2020    2:27 PM  Depression screen PHQ 2/9  Decreased Interest  0 0 0 0  Down, Depressed, Hopeless 0 0 0 0 0  PHQ - 2 Score 0 0 0 0 0        10/15/2022   10:28 AM  Fall Risk   Falls in the past year? 1  Number falls in past yr: 1  Injury with Fall? 0  Risk for fall due to : Impaired balance/gait  Follow up Falls evaluation completed;Falls prevention discussed    Patient Care Team: Jeremiah Russell, Fritzi Mandes, MD as PCP - General (Family Medicine)   Review of Systems  Constitutional:  Negative for chills and fever.  HENT:  Negative for congestion, rhinorrhea and sore throat.   Respiratory:  Negative for cough and shortness of breath.   Cardiovascular:  Negative for chest pain and palpitations.  Gastrointestinal:  Negative for abdominal pain, constipation, diarrhea, nausea and vomiting.  Genitourinary:  Negative for dysuria and urgency.  Musculoskeletal:  Positive for neck pain. Negative for arthralgias, back pain and myalgias.  Neurological:  Negative for dizziness and headaches.       Balance issues  Psychiatric/Behavioral:  Negative for dysphoric mood. The patient is not nervous/anxious.     Current  Outpatient Medications on File Prior to Visit  Medication Sig Dispense Refill   aspirin 81 MG chewable tablet Chew 81 mg by mouth daily. (Patient not taking: Reported on 10/15/2022)     Blood Pressure KIT 1 each by Does not apply route daily. 1 kit 0   No current facility-administered medications on file prior to visit.   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   Ataxic gait 06/04/2019   Essential hypertension    Impaired fasting glucose    Mini stroke    Mixed hyperlipidemia    Other rosacea    Stroke [facility])    cerebellar   Vertigo    Past Surgical History:  Procedure Laterality Date   ANAL FISSURE REPAIR     TONSILLECTOMY      Family History  Problem Relation Age of Onset   Alzheimer's disease Mother    Cancer Paternal Grandfather    Social History   Socioeconomic History   Marital status: Single    Spouse name: Not on file   Number of children: Not on file   Years of education: Not on file   Highest education level: Some college, no degree  Occupational History  Occupation: Retired  Tobacco Use   Smoking status: Former    Current packs/day: 0.00    Average packs/day: 3.0 packs/day for 66.0 years (198.0 ttl pk-yrs)    Types: Cigarettes    Start date: 01/04/1933    Quit date: 01/05/1999    Years since quitting: 23.7   Smokeless tobacco: Never  Substance and Sexual Activity   Alcohol use: Not Currently   Drug use: Never   Sexual activity: Not on file  Other Topics Concern   Not on file  Social History Narrative   Patient is right-handed. He lives alone in a 2 story house with a basement.   Social Determinants of Health   Financial Resource Strain: Low Risk  (04/28/2022)   Overall Financial Resource Strain (CARDIA)    Difficulty of Paying Living Expenses: Not hard at all  Food Insecurity: No Food Insecurity (04/28/2022)   Hunger Vital Sign    Worried About Running Out of Food in  the Last Year: Never true    Ran Out of Food in the Last Year: Never true  Transportation Needs: No Transportation Needs (04/28/2022)   PRAPARE - Administrator, Civil Service (Medical): No    Lack of Transportation (Non-Medical): No  Physical Activity: Insufficiently Active (04/28/2022)   Exercise Vital Sign    Days of Exercise per Week: 3 days    Minutes of Exercise per Session: 10 min  Stress: No Stress Concern Present (04/28/2022)   [employer] of Occupational Health - Occupational Stress Questionnaire    Feeling of Stress : Only a little  Social Connections: Socially Isolated (04/28/2022)   Social Connection and Isolation Panel [NHANES]    Frequency of Communication with Friends and Family: Once a week    Frequency of Social Gatherings with Friends and Family: Never    Attends Religious Services: Never    Diplomatic Services operational officer: No    Attends Engineer, structural: Never    Marital Status: Never married    Objective:  BP (!) 160/88   Pulse 84   Temp (!) 97.2 F (36.2 C)   Resp 18   Ht 5\' 8"  (1.727 m)   Wt 171 lb (77.6 kg)   BMI 26.00 kg/m      10/15/2022   10:24 AM 04/28/2022    4:06 PM 04/16/2022   10:24 AM  BP/Weight  Systolic BP 160  086  Diastolic BP 88  82  Wt. (Lbs) 171 181 181  BMI 26 kg/m2 27.52 kg/m2 27.52 kg/m2    Physical Exam  Diabetic Foot Exam - Simple   No data filed      Lab Results  Component Value Date   WBC 7.5 10/15/2022   HGB 16.8 10/15/2022   HCT 49.5 10/15/2022   PLT 308 10/15/2022   GLUCOSE 112 (H) 10/15/2022   CHOL 226 (H) 10/15/2022   TRIG 130 10/15/2022   HDL 55 10/15/2022   LDLCALC 148 (H) 10/15/2022   ALT 23 10/15/2022   AST 21 10/15/2022   NA 138 10/15/2022   K 5.5 (H) 10/15/2022   CL 101 10/15/2022   CREATININE 1.09 10/15/2022   BUN 9 10/15/2022   CO2 23 10/15/2022   TSH 1.110 07/23/2019   HGBA1C 5.6 10/15/2022   MICROALBUR 30 07/23/2019      Assessment & Plan:     Essential hypertension Assessment & Plan: Well controlled.  No medication changes recommended. Taking Losartan 25 mg daily, Aspirin 81 mg daily  Continue healthy diet and exercise.    Orders: -     CBC with Differential/Platelet -     Comprehensive metabolic panel -     Losartan Potassium; Take 1 tablet (25 mg total) by mouth daily.  Dispense: 90 tablet; Refill: 1  Mixed hyperlipidemia Assessment & Plan: Well controlled.  No changes to medicines. Taking rosuvastatin 10 mg daily. Continue to work on eating a healthy diet and exercise.  Labs drawn today.    Orders: -     Lipid panel  Impaired fasting glucose Assessment & Plan: Recommend continue to work on eating healthy diet and exercise.  Labs drawn today  Orders: -     Hemoglobin A1c -     metFORMIN HCl; Take 1 tablet (500 mg total) by mouth daily with breakfast.  Dispense: 90 tablet; Refill: 1  Need for hepatitis C screening test -     HCV Ab w Reflex to Quant PCR  Encounter for immunization -     Flu Vaccine Trivalent High Dose (Fluad) -     Pfizer Comirnaty Covid-19 Vaccine 18yrs & older  Other orders -     Rosuvastatin Calcium; Take 1 tablet (10 mg total) by mouth daily.  Dispense: 90 tablet; Refill: 1 -     Interpretation:     Meds ordered this encounter  Medications   rosuvastatin (CRESTOR) 10 MG tablet    Sig: Take 1 tablet (10 mg total) by mouth daily.    Dispense:  90 tablet    Refill:  1   losartan (COZAAR) 25 MG tablet    Sig: Take 1 tablet (25 mg total) by mouth daily.    Dispense:  90 tablet    Refill:  1   metFORMIN (GLUCOPHAGE) 500 MG tablet    Sig: Take 1 tablet (500 mg total) by mouth daily with breakfast.    Dispense:  90 tablet    Refill:  1    Orders Placed This Encounter  Procedures   Flu Vaccine Trivalent High Dose (Fluad)   Pfizer Comirnaty Covid-19 Vaccine 30yrs & older   CBC with Differential/Platelet   Comprehensive metabolic panel   Hemoglobin A1c   Lipid panel   HCV Ab w  Reflex to Quant PCR   Interpretation:     Follow-up: Return in about 6 months (around 04/15/2023) for chronic follow up.   I,Marla I Leal-Borjas,acting as a scribe for Blane Ohara, MD.,have documented all relevant documentation on the behalf of Blane Ohara, MD,as directed by  Blane Ohara, MD while in the presence of Blane Ohara, MD.   An After Visit Summary was printed and given to the patient.  Blane Ohara, MD [facility] [phone]

## 2022-10-15 ENCOUNTER — Ambulatory Visit (INDEPENDENT_AMBULATORY_CARE_PROVIDER_SITE_OTHER): Payer: Medicare Other | Admitting: Family Medicine

## 2022-10-15 ENCOUNTER — Encounter: Payer: Self-pay | Admitting: Family Medicine

## 2022-10-15 VITALS — BP 160/88 | HR 84 | Temp 97.2°F | Resp 18 | Ht 68.0 in | Wt 171.0 lb

## 2022-10-15 DIAGNOSIS — Z23 Encounter for immunization: Secondary | ICD-10-CM

## 2022-10-15 DIAGNOSIS — I69393 Ataxia following cerebral infarction: Secondary | ICD-10-CM

## 2022-10-15 DIAGNOSIS — Z1159 Encounter for screening for other viral diseases: Secondary | ICD-10-CM

## 2022-10-15 DIAGNOSIS — R7301 Impaired fasting glucose: Secondary | ICD-10-CM

## 2022-10-15 DIAGNOSIS — I10 Essential (primary) hypertension: Secondary | ICD-10-CM | POA: Diagnosis not present

## 2022-10-15 DIAGNOSIS — E782 Mixed hyperlipidemia: Secondary | ICD-10-CM

## 2022-10-15 MED ORDER — METFORMIN HCL 500 MG PO TABS
500.0000 mg | ORAL_TABLET | Freq: Every day | ORAL | 1 refills | Status: DC
Start: 2022-10-15 — End: 2023-04-27

## 2022-10-15 MED ORDER — ROSUVASTATIN CALCIUM 10 MG PO TABS
10.0000 mg | ORAL_TABLET | Freq: Every day | ORAL | 1 refills | Status: DC
Start: 2022-10-15 — End: 2023-04-27

## 2022-10-15 MED ORDER — LOSARTAN POTASSIUM 25 MG PO TABS
25.0000 mg | ORAL_TABLET | Freq: Every day | ORAL | 1 refills | Status: DC
Start: 2022-10-15 — End: 2023-04-18

## 2022-10-15 NOTE — Patient Instructions (Signed)
Recommend tetanus (tdap) and shingrix vaccine series at your pharmacy.

## 2022-10-16 LAB — COMPREHENSIVE METABOLIC PANEL
ALT: 23 [IU]/L (ref 0–44)
AST: 21 [IU]/L (ref 0–40)
Albumin: 4.4 g/dL (ref 3.8–4.8)
Alkaline Phosphatase: 113 [IU]/L (ref 44–121)
BUN/Creatinine Ratio: 8 — ABNORMAL LOW (ref 10–24)
BUN: 9 mg/dL (ref 8–27)
Bilirubin Total: 0.7 mg/dL (ref 0.0–1.2)
CO2: 23 mmol/L (ref 20–29)
Calcium: 9.9 mg/dL (ref 8.6–10.2)
Chloride: 101 mmol/L (ref 96–106)
Creatinine, Ser: 1.09 mg/dL (ref 0.76–1.27)
Globulin, Total: 3.2 g/dL (ref 1.5–4.5)
Glucose: 112 mg/dL — ABNORMAL HIGH (ref 70–99)
Potassium: 5.5 mmol/L — ABNORMAL HIGH (ref 3.5–5.2)
Sodium: 138 mmol/L (ref 134–144)
Total Protein: 7.6 g/dL (ref 6.0–8.5)
eGFR: 71 mL/min/{1.73_m2} (ref >59)

## 2022-10-16 LAB — CBC WITH DIFFERENTIAL/PLATELET
Basophils Absolute: 0.1 10*3/uL (ref 0.0–0.2)
Basos: 1 %
EOS (ABSOLUTE): 0.1 10*3/uL (ref 0.0–0.4)
Eos: 1 %
Hematocrit: 49.5 % (ref 37.5–51.0)
Hemoglobin: 16.8 g/dL (ref 13.0–17.7)
Immature Grans (Abs): 0 10*3/uL (ref 0.0–0.1)
Immature Granulocytes: 0 %
Lymphocytes Absolute: 1.6 10*3/uL (ref 0.7–3.1)
Lymphs: 22 %
MCH: 31.2 pg (ref 26.6–33.0)
MCHC: 33.9 g/dL (ref 31.5–35.7)
MCV: 92 fL (ref 79–97)
Monocytes Absolute: 0.6 10*3/uL (ref 0.1–0.9)
Monocytes: 7 %
Neutrophils Absolute: 5.1 10*3/uL (ref 1.4–7.0)
Neutrophils: 69 %
Platelets: 308 10*3/uL (ref 150–450)
RBC: 5.39 x10E6/uL (ref 4.14–5.80)
RDW: 13 % (ref 11.6–15.4)
WBC: 7.5 10*3/uL (ref 3.4–10.8)

## 2022-10-16 LAB — HCV AB W REFLEX TO QUANT PCR: HCV Ab: NONREACTIVE

## 2022-10-16 LAB — LIPID PANEL
Chol/HDL Ratio: 4.1 {ratio} (ref 0.0–5.0)
Cholesterol, Total: 226 mg/dL — ABNORMAL HIGH (ref 100–199)
HDL: 55 mg/dL (ref >39)
LDL Chol Calc (NIH): 148 mg/dL — ABNORMAL HIGH (ref 0–99)
Triglycerides: 130 mg/dL (ref 0–149)
VLDL Cholesterol Cal: 23 mg/dL (ref 5–40)

## 2022-10-16 LAB — HEMOGLOBIN A1C
Est. average glucose Bld gHb Est-mCnc: 114 mg/dL
Hgb A1c MFr Bld: 5.6 % (ref 4.8–5.6)

## 2022-10-16 LAB — HCV INTERPRETATION

## 2022-10-18 NOTE — Assessment & Plan Note (Signed)
Caution with ambulation.

## 2023-04-17 NOTE — Progress Notes (Unsigned)
 Subjective:  Patient ID: Jeremiah Russell, male    DOB: [birth year]  Age: 74 y.o. MRN: [id number]  Chief Complaint  Patient presents with   Medical Management of Chronic Issues    Discussed the use of AI scribe software for clinical note transcription with the patient, who gave verbal consent to proceed.  HPI: Hypertension: Patient is taking Losartan  25 mg daily, Aspirin 81 mg daily. He does not check his blood pressure at home.  Hyperlipidemia: Taking rosuvastatin  10 mg daily.  Prediabetes: last a1c was 5.6%, taking metformin  500 mg once a day. Tries to eat healthy, but not succeeding always.    Ataxia gait secondary to stroke: uses a walker.   Has been out of medication for 1.5 weeks.      04/18/2023   11:41 AM 10/15/2022   10:29 AM 04/28/2022    4:09 PM 04/16/2022   10:25 AM 03/09/2021    1:48 PM  Depression screen PHQ 2/9  Decreased Interest 0  0 0 0  Down, Depressed, Hopeless 0 0 0 0 0  PHQ - 2 Score 0 0 0 0 0        04/18/2023   11:41 AM  Fall Risk   Falls in the past year? 0  Number falls in past yr: 0  Injury with Fall? 0  Risk for fall due to : Impaired balance/gait;History of fall(s)  Follow up Falls evaluation completed    Patient Care Team: Mercy Stall, MD as PCP - General (Family Medicine)   Review of Systems  Constitutional:  Negative for chills, diaphoresis, fatigue and fever.  HENT:  Negative for congestion, ear pain and sore throat.   Respiratory:  Negative for cough and shortness of breath.   Cardiovascular:  Negative for chest pain and leg swelling.  Gastrointestinal:  Negative for abdominal pain, constipation, diarrhea, nausea and vomiting.  Genitourinary:  Negative for dysuria and urgency.  Musculoskeletal:  Negative for arthralgias and myalgias.  Neurological:  Negative for dizziness and headaches.  Psychiatric/Behavioral:  Negative for dysphoric mood.     Current Outpatient Medications on File Prior to Visit  Medication Sig Dispense Refill    aspirin 81 MG chewable tablet Chew 81 mg by mouth daily. (Patient not taking: Reported on 10/15/2022)     Blood Pressure KIT 1 each by Does not apply route daily. 1 kit 0   metFORMIN  (GLUCOPHAGE ) 500 MG tablet Take 1 tablet (500 mg total) by mouth daily with breakfast. 90 tablet 1   rosuvastatin  (CRESTOR ) 10 MG tablet Take 1 tablet (10 mg total) by mouth daily. 90 tablet 1   No current facility-administered medications on file prior to visit.   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)    cerebellar   Vertigo    Past Surgical History:  Procedure Laterality Date   ANAL FISSURE REPAIR     TONSILLECTOMY      Family History  Problem Relation Age of Onset   Alzheimer's disease Mother    Cancer Paternal Grandfather    Social History   Socioeconomic History   Marital status: Single    Spouse name: Not on file   Number of children: Not on file  Years of education: Not on file   Highest education level: Some college, no degree  Occupational History   Occupation: Retired  Tobacco Use   Smoking status: Former    Current packs/day: 0.00    Average packs/day: 3.0 packs/day for 66.0 years (198.0 ttl pk-yrs)    Types: Cigarettes    Start date: 01/04/1933    Quit date: 01/05/1999    Years since quitting: 24.3   Smokeless tobacco: Never  Substance and Sexual Activity   Alcohol use: Not Currently   Drug use: Never   Sexual activity: Not on file  Other Topics Concern   Not on file  Social History Narrative   Patient is right-handed. He lives alone in a 2 story house with a basement.   Social Drivers of Corporate investment banker Strain: Low Risk  (04/28/2022)   Overall Financial Resource Strain (CARDIA)    Difficulty of Paying Living Expenses: Not  hard at all  Food Insecurity: No Food Insecurity (04/18/2023)   Hunger Vital Sign    Worried About Running Out of Food in the Last Year: Never true    Ran Out of Food in the Last Year: Never true  Transportation Needs: No Transportation Needs (04/18/2023)   PRAPARE - Administrator, Civil Service (Medical): No    Lack of Transportation (Non-Medical): No  Physical Activity: Insufficiently Active (04/28/2022)   Exercise Vital Sign    Days of Exercise per Week: 3 days    Minutes of Exercise per Session: 10 min  Stress: No Stress Concern Present (04/28/2022)   [employer] of Occupational Health - Occupational Stress Questionnaire    Feeling of Stress : Only a little  Social Connections: Socially Isolated (04/18/2023)   Social Connection and Isolation Panel [NHANES]    Frequency of Communication with Friends and Family: Twice a week    Frequency of Social Gatherings with Friends and Family: Never    Attends Religious Services: Never    Diplomatic Services operational officer: No    Attends Engineer, structural: Never    Marital Status: Never married    Objective:  BP 136/80   Pulse 94   Temp 98.4 F (36.9 C)   Ht 5\' 8"  (1.727 m)   Wt 180 lb (81.6 kg)   SpO2 94%   BMI 27.37 kg/m      04/18/2023   11:38 AM 10/15/2022   10:24 AM 04/28/2022    4:06 PM  BP/Weight  Systolic BP 136 160   Diastolic BP 80 88   Wt. (Lbs) 180 171 181  BMI 27.37 kg/m2 26 kg/m2 27.52 kg/m2    Physical Exam  Diabetic Foot Exam - Simple   No data filed      Lab Results  Component Value Date   WBC 7.7 04/18/2023   HGB 17.3 04/18/2023   HCT 50.0 04/18/2023   PLT 290 04/18/2023   GLUCOSE 103 (H) 04/18/2023   CHOL 224 (H) 04/18/2023   TRIG 192 (H) 04/18/2023   HDL 42 04/18/2023   LDLCALC 147 (H) 04/18/2023   ALT 25 04/18/2023   AST 25 04/18/2023   NA 139 04/18/2023   K 4.7 04/18/2023   CL 103 04/18/2023   CREATININE 0.99 04/18/2023   BUN 9 04/18/2023   CO2 21  04/18/2023   TSH 1.110 07/23/2019   HGBA1C 6.1 (H) 04/18/2023   MICROALBUR 30 07/23/2019      Assessment & Plan:  Assessment and Plan  Essential hypertension Assessment & Plan: Well controlled.  No changes to medicines. Losartan  25 mg daily, Aspirin 81 mg daily.  Continue to work on eating a healthy diet and exercise.  Labs drawn today.    Orders: -     CBC with Differential/Platelet -     Comprehensive metabolic panel with GFR -     Losartan  Potassium; Take 1 tablet (25 mg total) by mouth daily.  Dispense: 90 tablet; Refill: 1  Impaired fasting glucose Assessment & Plan: Recommend continue to work on eating healthy diet and exercise.  Labs drawn today  Orders: -     Hemoglobin A1c  Mixed hyperlipidemia Assessment & Plan: Well controlled.  No changes to medicines. rosuvastatin  10 mg daily. Continue to work on eating a healthy diet and exercise.  Labs drawn today.    Orders: -     Lipid panel     Meds ordered this encounter  Medications   losartan  (COZAAR ) 25 MG tablet    Sig: Take 1 tablet (25 mg total) by mouth daily.    Dispense:  90 tablet    Refill:  1    Orders Placed This Encounter  Procedures   CBC with Differential/Platelet   Comprehensive metabolic panel with GFR   Hemoglobin A1c   Lipid panel     Follow-up: Return in about 4 months (around 08/18/2023) for awv, chronic follow up.   I,Marla I Leal-Borjas,acting as a scribe for Mercy Stall, MD.,have documented all relevant documentation on the behalf of Mercy Stall, MD,as directed by  Mercy Stall, MD while in the presence of Mercy Stall, MD.   An After Visit Summary was printed and given to the patient.  Mercy Stall, MD [facility] [phone]

## 2023-04-18 ENCOUNTER — Ambulatory Visit: Payer: Medicare Other | Admitting: Family Medicine

## 2023-04-18 ENCOUNTER — Encounter: Payer: Self-pay | Admitting: Family Medicine

## 2023-04-18 VITALS — BP 136/80 | HR 94 | Temp 98.4°F | Ht 68.0 in | Wt 180.0 lb

## 2023-04-18 DIAGNOSIS — E782 Mixed hyperlipidemia: Secondary | ICD-10-CM

## 2023-04-18 DIAGNOSIS — I10 Essential (primary) hypertension: Secondary | ICD-10-CM | POA: Diagnosis not present

## 2023-04-18 DIAGNOSIS — I69393 Ataxia following cerebral infarction: Secondary | ICD-10-CM | POA: Diagnosis not present

## 2023-04-18 DIAGNOSIS — R7301 Impaired fasting glucose: Secondary | ICD-10-CM | POA: Diagnosis not present

## 2023-04-18 MED ORDER — LOSARTAN POTASSIUM 25 MG PO TABS
25.0000 mg | ORAL_TABLET | Freq: Every day | ORAL | 1 refills | Status: DC
Start: 2023-04-18 — End: 2023-10-31

## 2023-04-19 LAB — COMPREHENSIVE METABOLIC PANEL WITH GFR
ALT: 25 IU/L (ref 0–44)
AST: 25 IU/L (ref 0–40)
Albumin: 4.3 g/dL (ref 3.8–4.8)
Alkaline Phosphatase: 99 IU/L (ref 44–121)
BUN/Creatinine Ratio: 9 — ABNORMAL LOW (ref 10–24)
BUN: 9 mg/dL (ref 8–27)
Bilirubin Total: 0.8 mg/dL (ref 0.0–1.2)
CO2: 21 mmol/L (ref 20–29)
Calcium: 9.6 mg/dL (ref 8.6–10.2)
Chloride: 103 mmol/L (ref 96–106)
Creatinine, Ser: 0.99 mg/dL (ref 0.76–1.27)
Globulin, Total: 3.1 g/dL (ref 1.5–4.5)
Glucose: 103 mg/dL — ABNORMAL HIGH (ref 70–99)
Potassium: 4.7 mmol/L (ref 3.5–5.2)
Sodium: 139 mmol/L (ref 134–144)
Total Protein: 7.4 g/dL (ref 6.0–8.5)
eGFR: 79 mL/min/{1.73_m2} (ref >59)

## 2023-04-19 LAB — CBC WITH DIFFERENTIAL/PLATELET
Basophils Absolute: 0.1 10*3/uL (ref 0.0–0.2)
Basos: 1 %
EOS (ABSOLUTE): 0.2 10*3/uL (ref 0.0–0.4)
Eos: 2 %
Hematocrit: 50 % (ref 37.5–51.0)
Hemoglobin: 17.3 g/dL (ref 13.0–17.7)
Immature Grans (Abs): 0 10*3/uL (ref 0.0–0.1)
Immature Granulocytes: 0 %
Lymphocytes Absolute: 1.9 10*3/uL (ref 0.7–3.1)
Lymphs: 25 %
MCH: 31.9 pg (ref 26.6–33.0)
MCHC: 34.6 g/dL (ref 31.5–35.7)
MCV: 92 fL (ref 79–97)
Monocytes Absolute: 0.7 10*3/uL (ref 0.1–0.9)
Monocytes: 9 %
Neutrophils Absolute: 4.8 10*3/uL (ref 1.4–7.0)
Neutrophils: 63 %
Platelets: 290 10*3/uL (ref 150–450)
RBC: 5.42 x10E6/uL (ref 4.14–5.80)
RDW: 12.7 % (ref 11.6–15.4)
WBC: 7.7 10*3/uL (ref 3.4–10.8)

## 2023-04-19 LAB — LIPID PANEL
Chol/HDL Ratio: 5.3 ratio — ABNORMAL HIGH (ref 0.0–5.0)
Cholesterol, Total: 224 mg/dL — ABNORMAL HIGH (ref 100–199)
HDL: 42 mg/dL (ref >39)
LDL Chol Calc (NIH): 147 mg/dL — ABNORMAL HIGH (ref 0–99)
Triglycerides: 192 mg/dL — ABNORMAL HIGH (ref 0–149)
VLDL Cholesterol Cal: 35 mg/dL (ref 5–40)

## 2023-04-19 LAB — HEMOGLOBIN A1C
Est. average glucose Bld gHb Est-mCnc: 128 mg/dL
Hgb A1c MFr Bld: 6.1 % — ABNORMAL HIGH (ref 4.8–5.6)

## 2023-04-22 NOTE — Assessment & Plan Note (Signed)
 Well controlled.  No changes to medicines. rosuvastatin  10 mg daily. Continue to work on eating a healthy diet and exercise.  Labs drawn today.

## 2023-04-22 NOTE — Assessment & Plan Note (Signed)
Recommend continue to work on eating healthy diet and exercise. Labs drawn today.  

## 2023-04-22 NOTE — Assessment & Plan Note (Signed)
 Well controlled.  No changes to medicines. Losartan  25 mg daily, Aspirin 81 mg daily.  Continue to work on eating a healthy diet and exercise.  Labs drawn today.

## 2023-04-24 NOTE — Assessment & Plan Note (Signed)
 Stable. Unable to improve.  Recommend use walker or cane.

## 2023-04-27 ENCOUNTER — Other Ambulatory Visit: Payer: Self-pay | Admitting: Family Medicine

## 2023-04-27 DIAGNOSIS — E782 Mixed hyperlipidemia: Secondary | ICD-10-CM

## 2023-04-27 DIAGNOSIS — R7301 Impaired fasting glucose: Secondary | ICD-10-CM

## 2023-04-27 MED ORDER — ROSUVASTATIN CALCIUM 10 MG PO TABS: 10.0000 mg | ORAL_TABLET | Freq: Every day | ORAL | 1 refills | Status: DC

## 2023-04-27 MED ORDER — METFORMIN HCL 500 MG PO TABS: 500.0000 mg | ORAL_TABLET | Freq: Every day | ORAL | 1 refills | Status: DC

## 2023-04-27 NOTE — Telephone Encounter (Signed)
 Copied from CRM [id number]. Topic: Clinical - Medication Refill >> Apr 27, 2023  2:24 PM Elle L wrote: Most Recent Primary Care Visit:  Provider: [facility]  Department[facility]  Visit Type: OFFICE VISIT  Date: 04/18/2023  Medication: metFORMIN  (GLUCOPHAGE ) 500 MG tablet AND rosuvastatin  (CRESTOR ) 10 MG tablet  The patient states he is unsure if he is still supposed to be taking Metformin  or not.   Has the patient contacted their pharmacy? No  Is this the correct pharmacy for this prescription? Yes  This is the patient's preferred pharmacy:  [id number], [place] - [address] AT [facility] OF [address] [place] [address] Phone: [phone] Fax: [phone]  Has the prescription been filled recently? No  Is the patient out of the medication? Yes  Has the patient been seen for an appointment in the last year OR does the patient have an upcoming appointment? Yes  Can we respond through MyChart? No  Agent: Please be advised that Rx refills may take up to 3 business days. We ask that you follow-up with your pharmacy.

## 2023-08-19 ENCOUNTER — Encounter: Payer: Self-pay | Admitting: Family Medicine

## 2023-08-19 ENCOUNTER — Ambulatory Visit (INDEPENDENT_AMBULATORY_CARE_PROVIDER_SITE_OTHER): Admitting: Family Medicine

## 2023-08-19 VITALS — BP 138/80 | HR 82 | Temp 99.1°F | Resp 16 | Ht 68.0 in | Wt 175.0 lb

## 2023-08-19 DIAGNOSIS — Z0000 Encounter for general adult medical examination without abnormal findings: Secondary | ICD-10-CM | POA: Diagnosis not present

## 2023-08-19 NOTE — Progress Notes (Addendum)
 Subjective:   Jeremiah Russell is a 73 y.o. male who presents for Medicare Annual/Subsequent preventive examination.  Visit Complete: In person  Patient Medicare AWV questionnaire was completed by the patient on 08/19/2023; I have confirmed that all information answered by patient is correct and no changes since this date.  Cardiac Risk Factors include: advanced age (>61men, >60 women);male gender     Objective:    Today's Vitals   08/19/23 1018  BP: 138/80  Pulse: 82  Resp: 16  Temp: 99.1 F (37.3 C)  TempSrc: Temporal  SpO2: 94%  Weight: 175 lb (79.4 kg)  Height: 5' 8 (1.727 m)  PainSc: 0-No pain   Body mass index is 26.61 kg/m.     04/28/2022    4:14 PM 03/09/2021    1:48 PM  Advanced Directives  Does Patient Have a Medical Advance Directive? Yes No  Type of Estate agent of [place];Living will   Does patient want to make changes to medical advance directive? No - Patient declined   Would patient like information on creating a medical advance directive?  No - Patient declined    Current Medications (verified) Outpatient Encounter Medications as of 08/19/2023  Medication Sig   aspirin 81 MG chewable tablet Chew 81 mg by mouth daily.   Blood Pressure KIT 1 each by Does not apply route daily.   losartan  (COZAAR ) 25 MG tablet Take 1 tablet (25 mg total) by mouth daily.   metFORMIN  (GLUCOPHAGE ) 500 MG tablet Take 1 tablet (500 mg total) by mouth daily with breakfast.   rosuvastatin  (CRESTOR ) 10 MG tablet Take 1 tablet (10 mg total) by mouth daily.   No facility-administered encounter medications on file as of 08/19/2023.    Allergies (verified) Patient has no known allergies.   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   Ataxic gait 06/04/2019   Essential hypertension    Impaired fasting glucose    Mini stroke    Mixed hyperlipidemia    Other rosacea     Stroke ([facility])    cerebellar   Vertigo    Past Surgical History:  Procedure Laterality Date   ANAL FISSURE REPAIR     TONSILLECTOMY     Family History  Problem Relation Age of Onset   Alzheimer's disease Mother    Cancer Paternal Grandfather    Social History   Socioeconomic History   Marital status: Single    Spouse name: Not on file   Number of children: Not on file   Years of education: Not on file   Highest education level: Some college, no degree  Occupational History   Occupation: Retired  Tobacco Use   Smoking status: Former    Current packs/day: 0.00    Types: Cigarettes    Quit date: 01/05/1999    Years since quitting: 24.6   Smokeless tobacco: Never  Substance and Sexual Activity   Alcohol use: Not Currently   Drug use: Never   Sexual activity: Not on file  Other Topics Concern   Not on file  Social History Narrative   Patient is right-handed. He lives alone in a 2 story house with a basement.   Social Drivers of Corporate investment banker Strain: Low Risk  (04/28/2022)   Overall Financial Resource Strain (CARDIA)    Difficulty of Paying Living Expenses: Not hard at all  Food Insecurity: No  Food Insecurity (04/18/2023)   Hunger Vital Sign    Worried About Running Out of Food in the Last Year: Never true    Ran Out of Food in the Last Year: Never true  Transportation Needs: No Transportation Needs (04/18/2023)   PRAPARE - Administrator, Civil Service (Medical): No    Lack of Transportation (Non-Medical): No  Physical Activity: Insufficiently Active (04/28/2022)   Exercise Vital Sign    Days of Exercise per Week: 3 days    Minutes of Exercise per Session: 10 min  Stress: No Stress Concern Present (04/28/2022)   [employer] of Occupational Health - Occupational Stress Questionnaire    Feeling of Stress : Only a little  Social Connections: Socially Isolated (04/18/2023)   Social Connection and Isolation Panel    Frequency of  Communication with Friends and Family: Twice a week    Frequency of Social Gatherings with Friends and Family: Never    Attends Religious Services: Never    Diplomatic Services operational officer: No    Attends Engineer, structural: Never    Marital Status: Never married    Tobacco Counseling Counseling given: Not Answered   Clinical Intake:  Pre-visit preparation completed: Yes  Pain : No/denies pain Pain Score: 0-No pain     BMI - recorded: 26.61 Nutritional Status: BMI 25 -29 Overweight Nutritional Risks: None Diabetes: No  How often do you need to have someone help you when you read instructions, pamphlets, or other written materials from your doctor or pharmacy?: 1 - Never What is the last grade level you completed in school?: COLLEGE  Interpreter Needed?: No      Activities of Daily Living    08/19/2023   10:27 AM  In your present state of health, do you have any difficulty performing the following activities:  Hearing? 0  Vision? 0  Difficulty concentrating or making decisions? 1  Walking or climbing stairs? 1  Dressing or bathing? 0  Doing errands, shopping? 1  Preparing Food and eating ? N  Using the Toilet? N  In the past six months, have you accidently leaked urine? Y  Do you have problems with loss of bowel control? N  Managing your Medications? N  Managing your Finances? N  Housekeeping or managing your Housekeeping? N    Patient Care Team: CoxAbigail, MD as PCP - General (Family Medicine)  Indicate any recent Medical Services you may have received from other than [facility] providers in the past year (date may be approximate).     Assessment:   This is a routine wellness examination for Jeremiah Russell.  Hearing/Vision screen No results found.   Goals Addressed             This Visit's Progress    Stay Active and Independent-       Timeframe:  Long-Range Goal Priority:  High Start Date:         08/19/23                      Expected End Date:  08/19/2023  Follow Up Date 08/19/2023   - Wants to stay alive   Why is this important?   Regular activity or exercise is important to managing back pain.  Activity helps to keep your muscles strong.  You will sleep better and feel more relaxed.  You will have more energy and feel less stressed.  If you are not active now, start slowly. Little  changes make a big difference.  Rest, but not too much.  Stay as active as you can and listen to your body's signals.     Notes:        Depression Screen    08/19/2023   10:31 AM 04/18/2023   11:41 AM 10/15/2022   10:29 AM 04/28/2022    4:09 PM 04/16/2022   10:25 AM 03/09/2021    1:48 PM 05/22/2020    2:27 PM  PHQ 2/9 Scores  PHQ - 2 Score 0 0 0 0 0 0 0    Fall Risk    08/19/2023   10:30 AM 04/18/2023   11:41 AM 10/15/2022   10:28 AM 04/16/2022   10:25 AM 03/09/2021    1:50 PM  Fall Risk   Falls in the past year? 0 0 1 1 1   Number falls in past yr: 0 0 1 0 0  Injury with Fall? 0 0 0 0 0  Risk for fall due to : No Fall Risks Impaired balance/gait;History of fall(s) Impaired balance/gait Impaired balance/gait Impaired balance/gait  Follow up Falls evaluation completed Falls evaluation completed Falls evaluation completed;Falls prevention discussed Falls evaluation completed Falls evaluation completed      Data saved with a previous flowsheet row definition    MEDICARE RISK AT HOME: Medicare Risk at Home Any stairs in or around the home?: Yes If so, are there any without handrails?: Yes Home free of loose throw rugs in walkways, pet beds, electrical cords, etc?: Yes Adequate lighting in your home to reduce risk of falls?: Yes Life alert?: No Use of a cane, walker or w/c?: Yes Grab bars in the bathroom?: Yes Shower chair or bench in shower?: Yes Elevated toilet seat or a handicapped toilet?: No  TIMED UP AND GO:  Was the test performed?  Yes  Length of time to ambulate 10 feet: 10 sec Gait slow and steady  without use of assistive device    Cognitive Function:    02/07/2020    3:18 PM  MMSE - Mini Mental State Exam  Orientation to time 5  Orientation to Place 5  Registration 3  Attention/ Calculation 5  Recall 3  Language- name 2 objects 2  Language- repeat 1  Language- follow 3 step command 3  Language- read & follow direction 1  Write a sentence 1  Copy design 1  Total score 30        08/19/2023   10:31 AM 04/28/2022    4:17 PM 03/09/2021    2:30 PM  6CIT Screen  What Year? 0 points 0 points 0 points  What month? 0 points 0 points 0 points  What time? 0 points 0 points 0 points  Count back from 20 0 points 0 points 0 points  Months in reverse 0 points 0 points 0 points  Repeat phrase 0 points 2 points 0 points  Total Score 0 points 2 points 0 points    Immunizations Immunization History  Administered Date(s) Administered   Fluad Quad(high Dose 65+) 11/01/2019, 02/26/2021, 10/09/2021   Fluad Trivalent(High Dose 65+) 10/15/2022   Influenza-Unspecified 09/13/2017   PFIZER Comirnaty(Gray Top)Covid-19 Tri-Sucrose Vaccine 05/22/2020   PFIZER(Purple Top)SARS-COV-2 Vaccination 05/09/2019, 05/23/2019   Pfizer Covid-19 Vaccine Bivalent Booster 58yrs & up 02/26/2021   Pfizer(Comirnaty)Fall Seasonal Vaccine 12 years and older 04/16/2022, 10/15/2022   Pneumococcal Conjugate-13 07/21/2015   Pneumococcal Polysaccharide-23 08/12/2016    TDAP status: Due, Education has been provided regarding the importance of this vaccine. Advised may receive  this vaccine at local pharmacy or Health Dept. Aware to provide a copy of the vaccination record if obtained from local pharmacy or Health Dept. Verbalized acceptance and understanding.  Flu Vaccine status: Due, Education has been provided regarding the importance of this vaccine. Advised may receive this vaccine at local pharmacy or Health Dept. Aware to provide a copy of the vaccination record if obtained from local pharmacy or Health Dept.  Verbalized acceptance and understanding.  Pneumococcal vaccine status: Up to date  Covid-19 vaccine status: Information provided on how to obtain vaccines.   Qualifies for Shingles Vaccine? Yes   Zostavax completed No   Shingrix Completed?: No.    Education has been provided regarding the importance of this vaccine. Patient has been advised to call insurance company to determine out of pocket expense if they have not yet received this vaccine. Advised may also receive vaccine at local pharmacy or Health Dept. Verbalized acceptance and understanding.  Screening Tests Health Maintenance  Topic Date Due   DTaP/Tdap/Td (1 - Tdap) Never done   Zoster Vaccines- Shingrix (1 of 2) Never done   COVID-19 Vaccine (7 - Pfizer risk 2024-25 season) 04/15/2023   INFLUENZA VACCINE  08/05/2023   Medicare Annual Wellness (AWV)  08/18/2024   Colonoscopy  09/21/2025   Pneumococcal Vaccine: 50+ Years  Completed   Hepatitis C Screening  Completed   HPV VACCINES  Aged Out   Meningococcal B Vaccine  Aged Out   Pneumococcal Vaccine  Discontinued    Health Maintenance  Health Maintenance Due  Topic Date Due   DTaP/Tdap/Td (1 - Tdap) Never done   Zoster Vaccines- Shingrix (1 of 2) Never done   COVID-19 Vaccine (7 - Pfizer risk 2024-25 season) 04/15/2023   INFLUENZA VACCINE  08/05/2023    Colorectal cancer screening: Type of screening: Colonoscopy. Completed 09/22/2015. Repeat every 10 years  Lung Cancer Screening: (Low Dose CT Chest recommended if Age 80-80 years, 20 pack-year currently smoking OR have quit w/in 15years.) does not qualify.   Lung Cancer Screening Referral: N/A  Additional Screening:  Hepatitis C Screening: does not qualify; Completed 10/15/2022  Vision Screening: Recommended annual ophthalmology exams for early detection of glaucoma and other disorders of the eye. Is the patient up to date with their annual eye exam?  Yes  Who is the provider or what is the name of the office in  which the patient attends annual eye exams? Dr Delinda If pt is not established with a provider, would they like to be referred to a provider to establish care? No .   Dental Screening: Recommended annual dental exams for proper oral hygiene  Diabetic Foot Exam: N/A  Community Resource Referral / Chronic Care Management: CRR required this visit?  No   CCM required this visit?  No     Plan:    Encounter for Medicare annual wellness exam Assessment & Plan: Up to date on screenings Needs Shingles and Tdap - advised him to get this at the pharmacy Flu and covid when she comes in for chronic fu  Things to do to keep yourself healthy  - Exercise at least 30-45 minutes a day, 3-4 days a week.  - Eat a low-fat diet with lots of fruits and vegetables, up to 7-9 servings per day.  - Seatbelts can save your life. Wear them always.  - Smoke detectors on every level of your home, check batteries every year.  - Eye Doctor - have an eye exam every 1-2 years  -  Alcohol -  If you drink, do it moderately, less than 2 drinks per day.  - Health Care Power of Attorney. Choose someone to speak for you if you are not able.  - Depression is common in our stressful world.If you're feeling down or losing interest in things you normally enjoy, please come in for a visit.  - Violence - If anyone is threatening or hurting you, please call immediately.               I have personally reviewed and noted the following in the patient's chart:   Medical and social history Use of alcohol, tobacco or illicit drugs  Current medications and supplements including opioid prescriptions. Patient is not currently taking opioid prescriptions. Functional ability and status Nutritional status Physical activity Advanced directives List of other physicians Hospitalizations, surgeries, and ER visits in previous 12 months Vitals Screenings to include cognitive, depression, and falls Referrals and appointments  In  addition, I have reviewed and discussed with patient certain preventive protocols, quality metrics, and best practice recommendations. A written personalized care plan for preventive services as well as general preventive health recommendations were provided to patient.    Harrie Cedar, FNP [facility] [phone]     08/19/2023   After Visit Summary: (Declined) Due to this being a telephonic visit, with patients personalized plan was offered to patient but patient Declined AVS at this time

## 2023-08-19 NOTE — Assessment & Plan Note (Signed)
 Up to date on screenings Needs Shingles and Tdap - advised him to get this at the pharmacy Flu and covid when she comes in for chronic fu  Things to do to keep yourself healthy  - Exercise at least 30-45 minutes a day, 3-4 days a week.  - Eat a low-fat diet with lots of fruits and vegetables, up to 7-9 servings per day.  - Seatbelts can save your life. Wear them always.  - Smoke detectors on every level of your home, check batteries every year.  - Eye Doctor - have an eye exam every 1-2 years  - Alcohol -  If you drink, do it moderately, less than 2 drinks per day.  - Health Care Power of Attorney. Choose someone to speak for you if you are not able.  - Depression is common in our stressful world.If you're feeling down or losing interest in things you normally enjoy, please come in for a visit.  - Violence - If anyone is threatening or hurting you, please call immediately.

## 2023-10-30 NOTE — Assessment & Plan Note (Signed)
 Jeremiah Russell

## 2023-10-30 NOTE — Progress Notes (Signed)
 "  Subjective:  Patient ID: Jeremiah Russell, male    DOB: 02-07-1950  Age: 73 y.o. MRN: [id number]  Chief Complaint  Patient presents with   Medical Management of Chronic Issues    HPI: Discussed the use of AI scribe software for clinical note transcription with the patient, who gave verbal consent to proceed.  History of Present Illness Jeremiah Russell is a 73 year old male who presents for a follow-up visit.  Hypertension - Takes losartan  25 mg daily - No chest pain, shortness of breath, or palpitations - No dizziness related to blood pressure changes  Type 2 diabetes mellitus - Takes metformin  500 mg daily - Last hemoglobin A1c was 5.6 - No polyuria, polydipsia, or unexplained weight loss  Hyperlipidemia - Takes rosuvastatin  10 mg daily - Cholesterol was elevated at last visit - No recent dietary or exercise changes - Unaware of any recommendations to increase cholesterol medication  Dizziness and falls - Clemens once since last visit due to slipping while grabbing something  Mobility and assistive devices - Uses a walker primarily when outside - Does not use walker frequently  General health maintenance - Received recent influenza vaccination - No recent illnesses - No depressive symptoms  Constitutional and other symptoms - No fevers, chills, sweats, earaches, sore throat, headaches, abdominal pain, bowel or bladder issues, joint pain, or muscle pain       08/19/2023   10:31 AM 04/18/2023   11:41 AM 10/15/2022   10:29 AM 04/28/2022    4:09 PM 04/16/2022   10:25 AM  Depression screen PHQ 2/9  Decreased Interest 0 0  0 0  Down, Depressed, Hopeless 0 0 0 0 0  PHQ - 2 Score 0 0 0 0 0        10/31/2023    1:40 PM  Fall Risk   Falls in the past year? 0  Number falls in past yr: 0  Injury with Fall? 0  Risk for fall due to : Impaired balance/gait  Follow up Falls evaluation completed    Patient Care Team: Sherre Clapper, MD as PCP - General (Family Medicine)    Review of Systems  Constitutional:  Negative for chills, fatigue and fever.  HENT:  Negative for congestion, ear pain and sore throat.   Respiratory:  Negative for cough and shortness of breath.   Cardiovascular:  Negative for chest pain.  Gastrointestinal:  Negative for abdominal pain, constipation, diarrhea, nausea and vomiting.  Genitourinary:  Negative for dysuria and frequency.  Musculoskeletal:  Negative for arthralgias and myalgias.  Neurological:  Positive for dizziness. Negative for headaches.  Psychiatric/Behavioral:  Negative for dysphoric mood.        No dysphoria    Current Outpatient Medications on File Prior to Visit  Medication Sig Dispense Refill   aspirin 81 MG chewable tablet Chew 81 mg by mouth daily.     Blood Pressure KIT 1 each by Does not apply route daily. 1 kit 0   No current facility-administered medications on file prior to visit.   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   Ataxic gait 06/04/2019   Essential hypertension    Impaired fasting glucose    Mini stroke    Mixed hyperlipidemia    Other rosacea    Stroke [facility])    cerebellar   Vertigo    Past Surgical History:  Procedure Laterality Date  ANAL FISSURE REPAIR     TONSILLECTOMY      Family History  Problem Relation Age of Onset   Alzheimer's disease Mother    Cancer Paternal Grandfather    Social History   Socioeconomic History   Marital status: Single    Spouse name: Not on file   Number of children: Not on file   Years of education: Not on file   Highest education level: Some college, no degree  Occupational History   Occupation: Retired  Tobacco Use   Smoking status: Former    Current packs/day: 0.00    Types: Cigarettes    Quit date: 01/05/1999    Years since quitting: 24.8   Smokeless tobacco: Never  Substance and Sexual Activity   Alcohol use: Not Currently   Drug use: Never   Sexual  activity: Not on file  Other Topics Concern   Not on file  Social History Narrative   Patient is right-handed. He lives alone in a 2 story house with a basement.   Social Drivers of Corporate Investment Banker Strain: Low Risk  (04/28/2022)   Overall Financial Resource Strain (CARDIA)    Difficulty of Paying Living Expenses: Not hard at all  Food Insecurity: No Food Insecurity (04/18/2023)   Hunger Vital Sign    Worried About Running Out of Food in the Last Year: Never true    Ran Out of Food in the Last Year: Never true  Transportation Needs: No Transportation Needs (04/18/2023)   PRAPARE - Administrator, Civil Service (Medical): No    Lack of Transportation (Non-Medical): No  Physical Activity: Insufficiently Active (04/28/2022)   Exercise Vital Sign    Days of Exercise per Week: 3 days    Minutes of Exercise per Session: 10 min  Stress: No Stress Concern Present (04/28/2022)   [employer] of Occupational Health - Occupational Stress Questionnaire    Feeling of Stress : Only a little  Social Connections: Socially Isolated (04/18/2023)   Social Connection and Isolation Panel    Frequency of Communication with Friends and Family: Twice a week    Frequency of Social Gatherings with Friends and Family: Never    Attends Religious Services: Never    Diplomatic Services Operational Officer: No    Attends Engineer, Structural: Never    Marital Status: Never married    Objective:  BP 120/76   Pulse 82   Temp 98.1 F (36.7 C)   Ht 5' 8 (1.727 m)   Wt 179 lb (81.2 kg)   SpO2 92%   BMI 27.22 kg/m      10/31/2023    1:38 PM 08/19/2023   10:18 AM 04/18/2023   11:38 AM  BP/Weight  Systolic BP 120 138 136  Diastolic BP 76 80 80  Wt. (Lbs) 179 175 180  BMI 27.22 kg/m2 26.61 kg/m2 27.37 kg/m2    Physical Exam Vitals reviewed.  Constitutional:      Appearance: Normal appearance.  Neck:     Vascular: No carotid bruit.  Cardiovascular:     Rate and  Rhythm: Normal rate and regular rhythm.     Pulses: Normal pulses.     Heart sounds: Normal heart sounds.  Pulmonary:     Effort: Pulmonary effort is normal.     Breath sounds: Normal breath sounds. No wheezing, rhonchi or rales.  Abdominal:     General: Bowel sounds are normal.     Palpations: Abdomen is soft.  Tenderness: There is no abdominal tenderness.  Neurological:     Mental Status: He is alert and oriented to person, place, and time.  Psychiatric:        Mood and Affect: Mood normal.        Behavior: Behavior normal.      Diabetic foot exam was performed with the following findings:   No deformities, ulcerations, or other skin breakdown Normal sensation of 10g monofilament Intact posterior tibialis and dorsalis pedis pulses      Lab Results  Component Value Date   WBC 7.7 04/18/2023   HGB 17.3 04/18/2023   HCT 50.0 04/18/2023   PLT 290 04/18/2023   GLUCOSE 100 (H) 10/31/2023   CHOL 137 10/31/2023   TRIG 89 10/31/2023   HDL 52 10/31/2023   LDLCALC 68 10/31/2023   ALT 26 10/31/2023   AST 21 10/31/2023   NA 138 10/31/2023   K 5.0 10/31/2023   CL 100 10/31/2023   CREATININE 0.99 10/31/2023   BUN 10 10/31/2023   CO2 25 10/31/2023   TSH 1.110 07/23/2019   HGBA1C 5.6 10/31/2023    Results for orders placed or performed in visit on 10/31/23  POCT glycosylated hemoglobin (Hb A1C)   Collection Time: 10/31/23  1:47 PM  Result Value Ref Range   Hemoglobin A1C     HbA1c POC (<> result, manual entry) 5.6 4.0 - 5.6 %   HbA1c, POC (prediabetic range)     HbA1c, POC (controlled diabetic range)    Lipid panel   Collection Time: 10/31/23  2:27 PM  Result Value Ref Range   Cholesterol, Total 137 100 - 199 mg/dL   Triglycerides 89 0 - 149 mg/dL   HDL 52 >60 mg/dL   VLDL Cholesterol Cal 17 5 - 40 mg/dL   LDL Chol Calc (NIH) 68 0 - 99 mg/dL   Chol/HDL Ratio 2.6 0.0 - 5.0 ratio  Comprehensive metabolic panel with GFR   Collection Time: 10/31/23  2:27 PM  Result  Value Ref Range   Glucose 100 (H) 70 - 99 mg/dL   BUN 10 8 - 27 mg/dL   Creatinine, Ser 9.00 0.76 - 1.27 mg/dL   eGFR 79 >40 fO/fpw/8.26   BUN/Creatinine Ratio 10 10 - 24   Sodium 138 134 - 144 mmol/L   Potassium 5.0 3.5 - 5.2 mmol/L   Chloride 100 96 - 106 mmol/L   CO2 25 20 - 29 mmol/L   Calcium  9.7 8.6 - 10.2 mg/dL   Total Protein 7.6 6.0 - 8.5 g/dL   Albumin 4.4 3.8 - 4.8 g/dL   Globulin, Total 3.2 1.5 - 4.5 g/dL   Bilirubin Total 0.8 0.0 - 1.2 mg/dL   Alkaline Phosphatase 100 47 - 123 IU/L   AST 21 0 - 40 IU/L   ALT 26 0 - 44 IU/L  .  Assessment & Plan:   Assessment & Plan Mixed hyperlipidemia Despite patient reporting he did not increase the crestor  after his last labs, his repeat cholesterol is excellent.  Recommend continue crestor  10 mg before bed.  Recommend continue to work on eating healthy diet and exercise.   Orders:   Lipid panel   Comprehensive metabolic panel with GFR  Impaired fasting glucose A1c at 5.6 indicates good glucose control. - Continue metformin  500 mg daily. Orders:   POCT glycosylated hemoglobin (Hb A1C)   metFORMIN  (GLUCOPHAGE ) 500 MG tablet; Take 1 tablet (500 mg total) by mouth daily with breakfast.  Essential hypertension Blood pressure well-controlled  on current regimen. Orders:   losartan  (COZAAR ) 25 MG tablet; Take 1 tablet (25 mg total) by mouth daily.  Ataxia due to old cerebrovascular accident (CVA) Stable. Using a walker.     Dizziness Chronic.     Encounter for immunization  Orders:   Flu vaccine HIGH DOSE PF(Fluzone Trivalent)  Body mass index is 27.22 kg/m.    Meds ordered this encounter  Medications   DISCONTD: rosuvastatin  (CRESTOR ) 20 MG tablet    Sig: Take 1 tablet (20 mg total) by mouth daily.    Dispense:  90 tablet    Refill:  0   metFORMIN  (GLUCOPHAGE ) 500 MG tablet    Sig: Take 1 tablet (500 mg total) by mouth daily with breakfast.    Dispense:  90 tablet    Refill:  1   losartan  (COZAAR ) 25 MG  tablet    Sig: Take 1 tablet (25 mg total) by mouth daily.    Dispense:  90 tablet    Refill:  1    Orders Placed This Encounter  Procedures   Flu vaccine HIGH DOSE PF(Fluzone Trivalent)   Lipid panel   Comprehensive metabolic panel with GFR   POCT glycosylated hemoglobin (Hb A1C)     I,Marla I Leal-Borjas,acting as a scribe for Abigail Free, MD.,have documented all relevant documentation on the behalf of Abigail Free, MD,as directed by  Abigail Free, MD while in the presence of Abigail Free, MD.    Follow-up: No follow-ups on file.  An After Visit Summary was printed and given to the patient.  I attest that I have reviewed this visit and agree with the plan scribed by my staff.   Abigail Free, MD [facility] [phone]    "

## 2023-10-30 NOTE — Assessment & Plan Note (Signed)
  Orders:   Lipid panel   Comprehensive metabolic panel with GFR   rosuvastatin  (CRESTOR ) 20 MG tablet; Take 1 tablet (20 mg total) by mouth daily.

## 2023-10-30 NOTE — Assessment & Plan Note (Signed)
 SABRA

## 2023-10-31 ENCOUNTER — Encounter: Payer: Self-pay | Admitting: Family Medicine

## 2023-10-31 ENCOUNTER — Ambulatory Visit (INDEPENDENT_AMBULATORY_CARE_PROVIDER_SITE_OTHER): Admitting: Family Medicine

## 2023-10-31 VITALS — BP 120/76 | HR 82 | Temp 98.1°F | Ht 68.0 in | Wt 179.0 lb

## 2023-10-31 DIAGNOSIS — E782 Mixed hyperlipidemia: Secondary | ICD-10-CM | POA: Diagnosis not present

## 2023-10-31 DIAGNOSIS — I10 Essential (primary) hypertension: Secondary | ICD-10-CM | POA: Diagnosis not present

## 2023-10-31 DIAGNOSIS — Z23 Encounter for immunization: Secondary | ICD-10-CM

## 2023-10-31 DIAGNOSIS — R7301 Impaired fasting glucose: Secondary | ICD-10-CM | POA: Diagnosis not present

## 2023-10-31 DIAGNOSIS — I69393 Ataxia following cerebral infarction: Secondary | ICD-10-CM | POA: Diagnosis not present

## 2023-10-31 DIAGNOSIS — R42 Dizziness and giddiness: Secondary | ICD-10-CM

## 2023-10-31 LAB — POCT GLYCOSYLATED HEMOGLOBIN (HGB A1C): HbA1c POC (<> result, manual entry): 5.6 % (ref 4.0–5.6)

## 2023-10-31 MED ORDER — LOSARTAN POTASSIUM 25 MG PO TABS: 25.0000 mg | ORAL_TABLET | Freq: Every day | ORAL | 1 refills | Status: AC

## 2023-10-31 MED ORDER — ROSUVASTATIN CALCIUM 20 MG PO TABS
20.0000 mg | ORAL_TABLET | Freq: Every day | ORAL | 0 refills | Status: DC
Start: 2023-10-31 — End: 2023-11-02

## 2023-10-31 MED ORDER — METFORMIN HCL 500 MG PO TABS: 500.0000 mg | ORAL_TABLET | Freq: Every day | ORAL | 1 refills | Status: AC

## 2023-10-31 NOTE — Patient Instructions (Signed)
  VISIT SUMMARY: You came in for a follow-up visit to check on your hypertension, diabetes, cholesterol, and dizziness. Your blood pressure and blood sugar levels are well-controlled, but your cholesterol remains high. You also mentioned experiencing dizziness with deep breathing and had a recent fall.  YOUR PLAN: MIXED HYPERLIPIDEMIA: Your cholesterol levels are still high. -Increase your rosuvastatin  to 20 mg before bed. -We will order blood tests to check your cholesterol, liver, and kidney function. -Refills for all your medications have been sent. -Please follow up in 3 months.  ESSENTIAL HYPERTENSION: Your blood pressure is well-controlled with your current medication. -Continue taking losartan  25 mg daily.  IMPAIRED FASTING GLUCOSE: Your blood sugar levels are well-controlled with your current medication. -Continue taking metformin  500 mg daily.  DIZZINESS: You experience dizziness with deep breathing, but not during falls. -Monitor your symptoms and let us  know if they worsen or change.  GENERAL HEALTH MAINTENANCE: You received your flu shot and have no recent illnesses or depressive symptoms. -Continue with your current health maintenance routine.                      Contains text generated by [employer]bridge.                                 Contains text generated by [employer]bridge.

## 2023-11-01 ENCOUNTER — Ambulatory Visit: Payer: Self-pay | Admitting: Family Medicine

## 2023-11-01 LAB — COMPREHENSIVE METABOLIC PANEL WITH GFR
ALT: 26 IU/L (ref 0–44)
AST: 21 IU/L (ref 0–40)
Albumin: 4.4 g/dL (ref 3.8–4.8)
Alkaline Phosphatase: 100 IU/L (ref 47–123)
BUN/Creatinine Ratio: 10 (ref 10–24)
BUN: 10 mg/dL (ref 8–27)
Bilirubin Total: 0.8 mg/dL (ref 0.0–1.2)
CO2: 25 mmol/L (ref 20–29)
Calcium: 9.7 mg/dL (ref 8.6–10.2)
Chloride: 100 mmol/L (ref 96–106)
Creatinine, Ser: 0.99 mg/dL (ref 0.76–1.27)
Globulin, Total: 3.2 g/dL (ref 1.5–4.5)
Glucose: 100 mg/dL — ABNORMAL HIGH (ref 70–99)
Potassium: 5 mmol/L (ref 3.5–5.2)
Sodium: 138 mmol/L (ref 134–144)
Total Protein: 7.6 g/dL (ref 6.0–8.5)
eGFR: 79 mL/min/1.73 (ref >59)

## 2023-11-01 LAB — LIPID PANEL
Chol/HDL Ratio: 2.6 ratio (ref 0.0–5.0)
Cholesterol, Total: 137 mg/dL (ref 100–199)
HDL: 52 mg/dL (ref >39)
LDL Chol Calc (NIH): 68 mg/dL (ref 0–99)
Triglycerides: 89 mg/dL (ref 0–149)
VLDL Cholesterol Cal: 17 mg/dL (ref 5–40)

## 2023-11-02 DIAGNOSIS — R42 Dizziness and giddiness: Secondary | ICD-10-CM | POA: Insufficient documentation

## 2023-11-02 MED ORDER — ROSUVASTATIN CALCIUM 10 MG PO TABS: 10.0000 mg | ORAL_TABLET | Freq: Every day | ORAL | 3 refills | Status: AC

## 2023-11-02 NOTE — Assessment & Plan Note (Signed)
 Dizziness occurs with deep breathing, not during falls.

## 2024-02-07 ENCOUNTER — Ambulatory Visit: Admitting: Family Medicine

## 2024-02-10 ENCOUNTER — Telehealth: Payer: Self-pay

## 2024-02-10 NOTE — Telephone Encounter (Signed)
 LVM to r/s 2/9 appointment. Can see any other provider that day. Dr. Sherre will be out of office.

## 2024-02-13 ENCOUNTER — Ambulatory Visit: Admitting: Physician Assistant

## 2024-02-13 ENCOUNTER — Ambulatory Visit: Admitting: Family Medicine
